# Patient Record
Sex: Male | Born: 1965
Health system: Southern US, Community
[De-identification: ages and names within clinical notes are randomized; demographics above are authoritative.]

## PROBLEM LIST (undated history)

## (undated) DIAGNOSIS — E669 Obesity, unspecified: Secondary | ICD-10-CM

## (undated) DIAGNOSIS — C73 Malignant neoplasm of thyroid gland: Secondary | ICD-10-CM

## (undated) DIAGNOSIS — E785 Hyperlipidemia, unspecified: Secondary | ICD-10-CM

## (undated) DIAGNOSIS — E079 Disorder of thyroid, unspecified: Secondary | ICD-10-CM

## (undated) DIAGNOSIS — I10 Essential (primary) hypertension: Secondary | ICD-10-CM

## (undated) HISTORY — DX: Malignant neoplasm of thyroid gland: C73

## (undated) HISTORY — DX: Essential (primary) hypertension: I10

## (undated) HISTORY — PX: OTHER SURGICAL HISTORY: SHX169

## (undated) HISTORY — DX: Hyperlipidemia, unspecified: E78.5

---

## 2006-04-12 ENCOUNTER — Ambulatory Visit (HOSPITAL_BASED_OUTPATIENT_CLINIC_OR_DEPARTMENT_OTHER): Admission: RE | Admit: 2006-04-12 | Discharge: 2006-04-12 | Payer: Self-pay | Admitting: Orthopedic Surgery

## 2007-10-09 ENCOUNTER — Encounter: Admission: RE | Admit: 2007-10-09 | Discharge: 2007-10-09 | Payer: Self-pay | Admitting: Orthopedic Surgery

## 2008-02-07 ENCOUNTER — Encounter: Admission: RE | Admit: 2008-02-07 | Discharge: 2008-02-07 | Payer: Self-pay | Admitting: Orthopedic Surgery

## 2008-05-06 ENCOUNTER — Emergency Department (HOSPITAL_COMMUNITY): Admission: EM | Admit: 2008-05-06 | Discharge: 2008-05-06 | Payer: Self-pay | Admitting: Emergency Medicine

## 2009-04-03 HISTORY — PX: THYROIDECTOMY: SHX17

## 2009-08-06 ENCOUNTER — Emergency Department (HOSPITAL_COMMUNITY): Admission: EM | Admit: 2009-08-06 | Discharge: 2009-08-06 | Payer: Self-pay | Admitting: Emergency Medicine

## 2009-10-27 ENCOUNTER — Encounter: Admission: RE | Admit: 2009-10-27 | Discharge: 2009-10-27 | Payer: Self-pay | Admitting: Endocrinology

## 2009-10-27 ENCOUNTER — Other Ambulatory Visit: Admission: RE | Admit: 2009-10-27 | Discharge: 2009-10-27 | Payer: Self-pay | Admitting: Interventional Radiology

## 2009-12-17 ENCOUNTER — Ambulatory Visit (HOSPITAL_COMMUNITY): Admission: RE | Admit: 2009-12-17 | Discharge: 2009-12-18 | Payer: Self-pay | Admitting: General Surgery

## 2009-12-17 ENCOUNTER — Encounter (INDEPENDENT_AMBULATORY_CARE_PROVIDER_SITE_OTHER): Payer: Self-pay | Admitting: General Surgery

## 2010-01-21 ENCOUNTER — Encounter (HOSPITAL_COMMUNITY)
Admission: RE | Admit: 2010-01-21 | Discharge: 2010-04-02 | Payer: Self-pay | Source: Home / Self Care | Attending: Endocrinology | Admitting: Endocrinology

## 2010-05-19 ENCOUNTER — Encounter: Payer: Self-pay | Admitting: Cardiology

## 2010-06-16 LAB — GLUCOSE, CAPILLARY
Glucose-Capillary: 100 mg/dL — ABNORMAL HIGH (ref 70–99)
Glucose-Capillary: 105 mg/dL — ABNORMAL HIGH (ref 70–99)

## 2010-06-16 LAB — SURGICAL PCR SCREEN
MRSA, PCR: NEGATIVE
Staphylococcus aureus: NEGATIVE

## 2010-06-16 LAB — COMPREHENSIVE METABOLIC PANEL
BUN: 25 mg/dL — ABNORMAL HIGH (ref 6–23)
Calcium: 9.7 mg/dL (ref 8.4–10.5)
Glucose, Bld: 120 mg/dL — ABNORMAL HIGH (ref 70–99)
Sodium: 140 mEq/L (ref 135–145)
Total Protein: 7.5 g/dL (ref 6.0–8.3)

## 2010-06-16 LAB — CALCIUM: Calcium: 9 mg/dL (ref 8.4–10.5)

## 2010-06-16 LAB — CBC
HCT: 38.3 % — ABNORMAL LOW (ref 39.0–52.0)
MCHC: 34 g/dL (ref 30.0–36.0)
MCV: 85.9 fL (ref 78.0–100.0)
RDW: 14.7 % (ref 11.5–15.5)

## 2010-06-21 LAB — RAPID URINE DRUG SCREEN, HOSP PERFORMED
Cocaine: NOT DETECTED
Opiates: NOT DETECTED
Tetrahydrocannabinol: NOT DETECTED

## 2010-06-21 LAB — CBC
HCT: 36.2 % — ABNORMAL LOW (ref 39.0–52.0)
Hemoglobin: 12.9 g/dL — ABNORMAL LOW (ref 13.0–17.0)
MCV: 84.4 fL (ref 78.0–100.0)
RBC: 4.29 MIL/uL (ref 4.22–5.81)
WBC: 6.3 10*3/uL (ref 4.0–10.5)

## 2010-06-21 LAB — DIFFERENTIAL
Basophils Absolute: 0.1 10*3/uL (ref 0.0–0.1)
Basophils Relative: 1 % (ref 0–1)
Lymphocytes Relative: 36 % (ref 12–46)
Monocytes Absolute: 0.7 10*3/uL (ref 0.1–1.0)
Neutro Abs: 3.1 10*3/uL (ref 1.7–7.7)

## 2010-06-21 LAB — URINALYSIS, ROUTINE W REFLEX MICROSCOPIC
Bilirubin Urine: NEGATIVE
Ketones, ur: NEGATIVE mg/dL
Nitrite: NEGATIVE
Specific Gravity, Urine: 1.005 (ref 1.005–1.030)
Urobilinogen, UA: 0.2 mg/dL (ref 0.0–1.0)
pH: 6.5 (ref 5.0–8.0)

## 2010-06-21 LAB — ETHANOL: Alcohol, Ethyl (B): <5 mg/dL (ref 0–10)

## 2010-06-21 LAB — GLUCOSE, CAPILLARY
Glucose-Capillary: 73 mg/dL (ref 70–99)
Glucose-Capillary: 99 mg/dL (ref 70–99)

## 2010-06-21 LAB — COMPREHENSIVE METABOLIC PANEL
Alkaline Phosphatase: 50 U/L (ref 39–117)
BUN: 27 mg/dL — ABNORMAL HIGH (ref 6–23)
CO2: 25 mEq/L (ref 19–32)
Chloride: 105 mEq/L (ref 96–112)
Creatinine, Ser: 1.49 mg/dL (ref 0.4–1.5)
GFR calc non Af Amer: 51 mL/min — ABNORMAL LOW (ref >60)
Glucose, Bld: 70 mg/dL (ref 70–99)
Potassium: 3.2 mEq/L — ABNORMAL LOW (ref 3.5–5.1)
Total Bilirubin: 0.5 mg/dL (ref 0.3–1.2)

## 2010-07-19 LAB — BASIC METABOLIC PANEL
BUN: 23 mg/dL (ref 6–23)
Calcium: 9.4 mg/dL (ref 8.4–10.5)
Chloride: 104 mEq/L (ref 96–112)
Creatinine, Ser: 1.38 mg/dL (ref 0.4–1.5)
GFR calc Af Amer: >60 mL/min (ref >60)
GFR calc non Af Amer: 56 mL/min — ABNORMAL LOW (ref >60)

## 2010-07-19 LAB — CBC
Platelets: 273 10*3/uL (ref 150–400)
RBC: 4.73 MIL/uL (ref 4.22–5.81)
WBC: 17.5 10*3/uL — ABNORMAL HIGH (ref 4.0–10.5)

## 2010-07-19 LAB — DIFFERENTIAL
Eosinophils Absolute: 0.1 10*3/uL (ref 0.0–0.7)
Lymphocytes Relative: 3 % — ABNORMAL LOW (ref 12–46)
Lymphs Abs: 0.5 10*3/uL — ABNORMAL LOW (ref 0.7–4.0)
Monocytes Relative: 4 % (ref 3–12)
Neutro Abs: 16.2 10*3/uL — ABNORMAL HIGH (ref 1.7–7.7)
Neutrophils Relative %: 93 % — ABNORMAL HIGH (ref 43–77)

## 2010-08-19 NOTE — Op Note (Signed)
NAME:  George Crane, George Crane NO.:  [id number]   MEDICAL RECORD NO.:  [id number]          PATIENT TYPE:  AMB   LOCATION:  [facility]                         FACILITY:  [facility]   PHYSICIAN:  Deidre Ala, M.D.    DATE OF BIRTH:  Mar 08, 1966   DATE OF PROCEDURE:  04/12/2006  DATE OF DISCHARGE:                               OPERATIVE REPORT   PREOPERATIVE DIAGNOSIS:  1. 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  2. 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   POSTOPERATIVE DIAGNOSIS:  1. Right knee tricompartmental DJD.  2. Degenerative medial lateral meniscus tears.  3. Tight lateral retinaculum.  4. Gouty deposition.  5. Left knee tricompartmental DJD.  6. Degenerative medial lateral meniscus tears.  7. Tight lateral retinaculum.  8. Gouty deposition.   OPERATION:  1. Right knee partial medial lateral meniscectomies.  2. Right knee abrasion ablation chondroplasty tricompartmental.  3. Right knee medial lateral plica excision.  4. Right knee lateral retinacular release.  5. Left knee medial lateral partial meniscectomies.  6. Left knee abrasion ablation chondroplasty tricompartmental.  7. Left knee medial plica excisions.  8. Lateral retinacular release.   SURGEON:  Dr. Doristine Section   ASSISTANT:  Phineas Semen, P.A.   ANESTHESIA:  General endotracheal.   CULTURES AND DRAINS:  None.   BLOOD LOSS:  Minimal.   TOURNIQUET TIME:  Right knee 1 hour 15 minutes, left knee 47 minutes.   PATHOLOGIC FINDINGS AND HISTORY:  George Crane is a 45 year old who  presented December 06, 2005 with bilateral knee pain of years' duration  with a history of gout on colchicine and allopurinol, never had any  crystal analysis but the medicines did seem to make him better.  He had  catching, locking, giving way. He was not bone on bone, slightly narrow  medially, or early osteophyte medial joint line.  We saw him and started  him on a cortisone  injection and we saw him back December 27, 2005 and  he had a great deal of improvement. By March 16, 2006 his pain  recurred.  He had been set up for knee arthroscopies because of a  reflare and we actually set him up on that date. On December 21,2007 we  gave him left knee aspiration and aspirated his knee and gave him  cortisone and then because of continuation of his discomfort he desired  to proceed with surgical intervention.  At surgery he basically had the  same findings in both knees.  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  ACL was intact.  There was  degenerative inner rim medial and meniscus tears on both knees. Lateral  meniscus on the right knee had a flipped-in component.  There was also  some pannus of scar in the pouch on the right that was all debrided.  He  had thorough abrasion ablation chondroplasties, lateral retinacular  release for lateral patellar tilt and track, partial meniscectomies and  plica excisions.  The plicas were huge and I am sure the thick synovium  anteriorly was part of his symptom complex.   DESCRIPTION OF PROCEDURE:  With adequate anesthesia obtained using  endotracheal technique, 1 gram Ancef given IV prophylaxis, the patient  was placed in the supine position.  Both lower extremities were prepped  from the malleoli to the tourniquets to the leg holders in the standard  fashion.  After standard prepping and draping we started on the left  knee, Esmarch examination used and the tourniquet was let up to 400  mmHg.  Superior lateral inflow portal was made and the knee was  insufflated normal saline with arthroscopic pump.  Medial lateral scope  portals were then made and the joint was thoroughly inspected.  I then  shaved out the medial plica back to the sidewall and  lysed the medial  band.  I then shaved the trochlear defect with basket and shaver and  smoothed with the ablator on 1 as well as the posterior patella.  I then  isolated the medial meniscus and shaved the inner rim with basket and  smoothed with the ablator as well as the medial femoral condyle.  Portals reversed and I then did similar shavings on the lateral meniscus  and smoothed with the ablator on 1.  Pouch was then synovectomized and  lateral release carried out.  All joint surfaces were smoothed.  The  knee was then irrigated through the scope, 0.5% Marcaine with morphine  was injected in the joint and portals. The portals were closed with 4-0  nylon.  A bulky sterile compressive dressing was applied with lateral  foam pad for tamponade and EZ-wrap placed for later.  The patient then  was turned to the right knee where the exact same procedure was repeated  as the left knee except for we reduced the lateral meniscus flipped-in  tear and resected it on the right and left the portals open for drainage  on the right side.  Otherwise the procedures were identical.  Compression dressing was applied.  The patient having tolerated  procedure well was awakened, taken to recovery room in satisfactory  condition to be discharged per outpatient routine, given Percocet and  Mepergan for pain, told to call the office for appointment for recheck  tomorrow.           ______________________________  V. Charlesetta Shanks, M.D.     VEP/MEDQ  D:  04/12/2006  T:  04/13/2006  Job:  [id number]

## 2010-08-31 ENCOUNTER — Ambulatory Visit (INDEPENDENT_AMBULATORY_CARE_PROVIDER_SITE_OTHER): Payer: 59 | Admitting: Cardiology

## 2010-08-31 ENCOUNTER — Encounter: Payer: Self-pay | Admitting: Cardiology

## 2010-08-31 DIAGNOSIS — R9431 Abnormal electrocardiogram [ECG] [EKG]: Secondary | ICD-10-CM

## 2010-08-31 DIAGNOSIS — I10 Essential (primary) hypertension: Secondary | ICD-10-CM

## 2010-08-31 DIAGNOSIS — R41840 Attention and concentration deficit: Secondary | ICD-10-CM | POA: Insufficient documentation

## 2010-08-31 DIAGNOSIS — E785 Hyperlipidemia, unspecified: Secondary | ICD-10-CM | POA: Insufficient documentation

## 2010-08-31 NOTE — Progress Notes (Signed)
HPI: 45 yo male with no prior cardiac history for evaluation of abnormal ECG. The patient has dyspnea with more extreme activities but not with routine activities. It is relieved with rest. It is not associated with chest pain. There is no orthopnea, PND or pedal edema. There is no syncope or palpitations. There is no exertional chest pain.   Current Outpatient Prescriptions  Medication Sig Dispense Refill  . amLODipine-valsartan (EXFORGE) 10-320 MG per tablet Take 1 tablet by mouth daily.        . Calcium Carbonate Antacid (TUMS PO) 1 tab po qd       . Choline Fenofibrate (TRILIPIX) 135 MG capsule Take 135 mg by mouth daily.        . febuxostat (ULORIC) 40 MG tablet Take 80 mg by mouth daily.        . hydrochlorothiazide 25 MG tablet Take 25 mg by mouth daily.        . insulin glargine (LANTUS) 100 UNIT/ML injection 30 units daily       . levothyroxine (LEVOTHROID) 137 MCG tablet 2 tabs po morning       . metformin (FORTAMET) 1000 MG (OSM) 24 hr tablet Take 1,000 mg by mouth 2 (two) times daily with a meal.        . Omega-3 Fatty Acids (FISH OIL) 1000 MG CAPS 1 tab po qd       . omeprazole (PRILOSEC) 20 MG capsule Take 20 mg by mouth daily.          Not on File  Past Medical History  Diagnosis Date  . Diabetes mellitus   . Hypertension   . Hyperlipidemia   . Thyroid cancer     Past Surgical History  Procedure Date  . Thyroidectomy 2011  . Right arm surgery     History   Social History  . Marital Status: Married    Spouse Name: N/A    Number of Children: 1  . Years of Education: N/A   Occupational History  .      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   Social History Main Topics  . Smoking status: Never Smoker   . Smokeless tobacco: Not on file  . Alcohol Use: Not on file  . Drug Use: Not on file  . Sexually Active: Not on file   Other Topics Concern  . Not on file   Social History Narrative  . No narrative on file    Family History  Problem Relation Age of Onset    . Colon cancer Father   . Lung cancer Mother     ROS: no fevers or chills, productive cough, hemoptysis, dysphasia, odynophagia, melena, hematochezia, dysuria, hematuria, rash, seizure activity, orthopnea, PND, pedal edema, claudication. Remaining systems are negative.  Physical Exam: General:  Well developed/obese in NAD Skin warm/dry; mild rash over lower extremties Patient not depressed No peripheral clubbing Back-normal HEENT-normal/normal eyelids Neck supple/normal carotid upstroke bilaterally; no bruits; no JVD; no thyromegaly chest - CTA/ normal expansion CV - RRR/normal S1 and S2; no murmurs, rubs or gallops;  PMI nondisplaced Abdomen -NT/ND, no HSM, no mass, + bowel sounds, no bruit 2+ femoral pulses, no bruits Ext-no edema, chords, 2+ DP Neuro-grossly nonfocal  ECG Normal sinus rhythm, RV conduction delay.

## 2010-08-31 NOTE — Assessment & Plan Note (Signed)
Electrocardiogram shows an RV conduction delay but otherwise unremarkable. He does have multiple risk factors and has some dyspnea on exertion. I have also recommended an exercise program. Plan exercise treadmill prior to initiating. If negative no further evaluation needed.

## 2010-08-31 NOTE — Patient Instructions (Signed)
Your physician recommends that you schedule a follow-up appointment as needed with Dr. Jens Som. Your physician has requested that you have an exercise tolerance test. For further information please visit [email or web address] Please also follow instruction sheet, as given.

## 2010-08-31 NOTE — Assessment & Plan Note (Signed)
Blood pressure controlled with present medications. Management per primary care.

## 2010-08-31 NOTE — Assessment & Plan Note (Signed)
Management per primary care. 

## 2010-09-14 ENCOUNTER — Encounter: Payer: 59 | Admitting: Physician Assistant

## 2010-09-19 ENCOUNTER — Encounter: Payer: 59 | Admitting: Physician Assistant

## 2010-10-24 ENCOUNTER — Ambulatory Visit (INDEPENDENT_AMBULATORY_CARE_PROVIDER_SITE_OTHER): Payer: 59 | Admitting: Physician Assistant

## 2010-10-24 DIAGNOSIS — R9431 Abnormal electrocardiogram [ECG] [EKG]: Secondary | ICD-10-CM

## 2010-10-24 NOTE — Progress Notes (Deleted)
Exercise Treadmill Test  Pre-Exercise Testing Evaluation Rhythm: normal sinus  Rate: 84   PR:  .15 QRS:  .09  QT:  .38 QTc: .45     Test  Exercise Tolerance Test Ordering MD: Olga Millers, MD  Interpreting MD:  Robet Leu PA-C  Unique Test No: 1  Treadmill:  1  Indication for ETT: Abnormal EKG  Contraindication to ETT: No   Stress Modality: exercise - treadmill  Cardiac Imaging Performed: non   Protocol: standard Bruce - maximal  Max BP:  ***/***  Max MPHR (bpm):  175 85% MPR (bpm):  148  MPHR obtained (bpm):  *** % MPHR obtained:  ***  Reached 85% MPHR (min:sec):  *** Total Exercise Time (min-sec):  ***  Workload in METS:  *** Borg Scale: ***  Reason ETT Terminated:  {CHL REASON TERMINATED FOR ETT:21021064}    ST Segment Analysis At Rest: {CHL ST SEGMENT AT REST FOR JYN:82956213} With Exercise: {CHL ST SEGMENT WITH EXERCISE FOR YQM:57846962}  Other Information Arrhythmia:  {CHL ARRHYTHMIA FOR XBM:84132440} Angina during ETT:  {CHL ANGINA DURING NUU:72536644} Quality of ETT:  {CHL QUALITY OF IHK:74259563}  ETT Interpretation:  {CHL INTERPRETATION FOR OVF:64332951}  Comments: ***  Recommendations: ***

## 2010-10-24 NOTE — Progress Notes (Signed)
Exercise Treadmill Test  Pre-Exercise Testing Evaluation Rhythm: normal sinus  Rate: 84   PR:  .15 QRS:  .09  QT:  .38 QTc: .45     Test  Exercise Tolerance Test Ordering MD: Olga Millers, MD  Interpreting MD:  Robet Leu PA-C  Unique Test No: 1  Treadmill:  1  Indication for ETT: Abnormal EKG  Contraindication to ETT: No   Stress Modality: exercise - treadmill  Cardiac Imaging Performed: non   Protocol: standard Bruce - maximal  Max BP:  216/39  Max MPHR (bpm):  175 85% MPR (bpm):  148  MPHR obtained (bpm):  162 % MPHR obtained:  92  Reached 85% MPHR (min:sec): 3:50 Total Exercise Time (min-sec):  5:26  Workload in METS:  7.5 Borg Scale: 17  Reason ETT Terminated:  desired heart rate attained    ST Segment Analysis At Rest: normal ST segments - no evidence of significant ST depression With Exercise: no evidence of significant ST depression  Other Information Arrhythmia:  No Angina during ETT:  absent (0) Quality of ETT:  diagnostic  ETT Interpretation:  normal - no evidence of ischemia by ST analysis  Comments: Fair exercise tolerance. No chest pain. Normal BP response to exercise. No ST-T changes to suggest ischemia.  Recommendations: Follow up with Dr. Jens Som as indicated.

## 2011-03-03 ENCOUNTER — Other Ambulatory Visit (HOSPITAL_COMMUNITY): Payer: Self-pay | Admitting: Endocrinology

## 2011-03-03 DIAGNOSIS — C73 Malignant neoplasm of thyroid gland: Secondary | ICD-10-CM

## 2011-03-20 ENCOUNTER — Encounter (HOSPITAL_COMMUNITY)
Admission: RE | Admit: 2011-03-20 | Discharge: 2011-03-20 | Disposition: A | Discharge status: Home / Self Care | Payer: 59 | Source: Ambulatory Visit | Attending: Endocrinology | Admitting: Endocrinology

## 2011-03-20 DIAGNOSIS — C73 Malignant neoplasm of thyroid gland: Secondary | ICD-10-CM | POA: Insufficient documentation

## 2011-03-20 MED ORDER — THYROTROPIN ALFA 1.1 MG IM SOLR
0.9000 mg | INTRAMUSCULAR | Status: AC
  Administered 2011-03-20: 0.9 mg via INTRAMUSCULAR
  Filled 2011-03-20: qty 0.9

## 2011-03-21 ENCOUNTER — Encounter (HOSPITAL_COMMUNITY)
Admission: RE | Admit: 2011-03-21 | Discharge: 2011-03-21 | Disposition: A | Discharge status: Home / Self Care | Payer: 59 | Source: Ambulatory Visit | Attending: Endocrinology | Admitting: Endocrinology

## 2011-03-21 DIAGNOSIS — C73 Malignant neoplasm of thyroid gland: Secondary | ICD-10-CM | POA: Insufficient documentation

## 2011-03-21 MED ORDER — THYROTROPIN ALFA 1.1 MG IM SOLR
0.9000 mg | INTRAMUSCULAR | Status: AC
  Administered 2011-03-21: 0.9 mg via INTRAMUSCULAR
  Filled 2011-03-21: qty 0.9

## 2011-03-22 ENCOUNTER — Encounter (HOSPITAL_COMMUNITY)
Admission: RE | Admit: 2011-03-22 | Discharge: 2011-03-22 | Disposition: A | Discharge status: Home / Self Care | Payer: 59 | Source: Ambulatory Visit | Attending: Endocrinology | Admitting: Endocrinology

## 2011-03-22 DIAGNOSIS — C73 Malignant neoplasm of thyroid gland: Secondary | ICD-10-CM | POA: Insufficient documentation

## 2011-03-22 MED ORDER — SODIUM IODIDE I 131 CAPSULE
4.0000 | Freq: Once | INTRAVENOUS | Status: AC | PRN
  Administered 2011-03-22: 4 via ORAL

## 2011-03-24 ENCOUNTER — Ambulatory Visit (HOSPITAL_COMMUNITY)
Admission: RE | Admit: 2011-03-24 | Discharge: 2011-03-24 | Disposition: A | Discharge status: Home / Self Care | Payer: 59 | Source: Ambulatory Visit | Attending: Endocrinology | Admitting: Endocrinology

## 2011-03-24 DIAGNOSIS — C73 Malignant neoplasm of thyroid gland: Secondary | ICD-10-CM | POA: Insufficient documentation

## 2011-03-24 MED ORDER — SODIUM IODIDE I 131 CAPSULE
4.0000 | Freq: Once | INTRAVENOUS | Status: AC | PRN
  Administered 2011-03-24: 4 via ORAL

## 2013-04-11 ENCOUNTER — Other Ambulatory Visit (HOSPITAL_COMMUNITY): Payer: Self-pay | Admitting: Endocrinology

## 2013-04-11 DIAGNOSIS — C73 Malignant neoplasm of thyroid gland: Secondary | ICD-10-CM

## 2013-04-28 ENCOUNTER — Encounter (HOSPITAL_COMMUNITY)
Admission: RE | Admit: 2013-04-28 | Discharge: 2013-04-28 | Disposition: A | Discharge status: Home / Self Care | Payer: 59 | Source: Ambulatory Visit | Attending: Endocrinology | Admitting: Endocrinology

## 2013-04-28 ENCOUNTER — Encounter (HOSPITAL_COMMUNITY): Payer: Self-pay

## 2013-04-28 DIAGNOSIS — C73 Malignant neoplasm of thyroid gland: Secondary | ICD-10-CM | POA: Insufficient documentation

## 2013-04-28 MED ORDER — THYROTROPIN ALFA 1.1 MG IM SOLR
0.9000 mg | INTRAMUSCULAR | Status: AC
  Administered 2013-04-28: 0.9 mg via INTRAMUSCULAR

## 2013-04-28 MED ORDER — THYROTROPIN ALFA 1.1 MG IM SOLR
0.9000 mg | INTRAMUSCULAR | Status: AC
  Administered 2013-04-29: 0.9 mg via INTRAMUSCULAR

## 2013-04-28 MED ORDER — THYROTROPIN ALFA 1.1 MG IM SOLR
INTRAMUSCULAR | Status: AC
  Administered 2013-04-28: 0.9 mg via INTRAMUSCULAR
  Filled 2013-04-28: qty 0.9

## 2013-04-29 ENCOUNTER — Encounter (HOSPITAL_COMMUNITY)
Admission: RE | Admit: 2013-04-29 | Discharge: 2013-04-29 | Disposition: A | Discharge status: Home / Self Care | Payer: 59 | Source: Ambulatory Visit | Attending: Endocrinology | Admitting: Endocrinology

## 2013-04-30 ENCOUNTER — Encounter (HOSPITAL_COMMUNITY)
Admission: RE | Admit: 2013-04-30 | Discharge: 2013-04-30 | Disposition: A | Discharge status: Home / Self Care | Payer: 59 | Source: Ambulatory Visit | Attending: Endocrinology | Admitting: Endocrinology

## 2013-04-30 MED ORDER — SODIUM IODIDE I 131 CAPSULE
4.0000 | Freq: Once | INTRAVENOUS | Status: AC | PRN
  Administered 2013-04-30: 4 via ORAL

## 2013-05-02 ENCOUNTER — Encounter (HOSPITAL_COMMUNITY)
Admission: RE | Admit: 2013-05-02 | Discharge: 2013-05-02 | Disposition: A | Discharge status: Home / Self Care | Payer: 59 | Source: Ambulatory Visit | Attending: Endocrinology | Admitting: Endocrinology

## 2014-01-07 ENCOUNTER — Encounter: Payer: Self-pay | Admitting: Physician Assistant

## 2014-04-15 ENCOUNTER — Other Ambulatory Visit (HOSPITAL_COMMUNITY): Payer: Self-pay | Admitting: Endocrinology

## 2014-04-15 DIAGNOSIS — C73 Malignant neoplasm of thyroid gland: Secondary | ICD-10-CM

## 2014-05-11 ENCOUNTER — Encounter (HOSPITAL_COMMUNITY): Admission: RE | Admit: 2014-05-11 | Payer: 59 | Source: Ambulatory Visit

## 2014-05-12 ENCOUNTER — Encounter (HOSPITAL_COMMUNITY): Payer: 59

## 2014-05-13 ENCOUNTER — Encounter (HOSPITAL_COMMUNITY)
Admission: RE | Admit: 2014-05-13 | Discharge: 2014-05-13 | Disposition: A | Discharge status: Home / Self Care | Payer: 59 | Source: Ambulatory Visit | Attending: Endocrinology | Admitting: Endocrinology

## 2014-05-13 ENCOUNTER — Encounter (HOSPITAL_COMMUNITY): Payer: 59

## 2014-05-13 ENCOUNTER — Encounter (HOSPITAL_COMMUNITY): Payer: Self-pay

## 2014-05-13 DIAGNOSIS — C73 Malignant neoplasm of thyroid gland: Secondary | ICD-10-CM | POA: Diagnosis present

## 2014-05-13 MED ORDER — THYROTROPIN ALFA 1.1 MG IM SOLR
INTRAMUSCULAR | Status: AC
  Administered 2014-05-13: 0.9 mg via INTRAMUSCULAR
  Filled 2014-05-13: qty 0.9

## 2014-05-13 MED ORDER — THYROTROPIN ALFA 1.1 MG IM SOLR
0.9000 mg | INTRAMUSCULAR | Status: AC
  Administered 2014-05-13: 0.9 mg via INTRAMUSCULAR

## 2014-05-14 ENCOUNTER — Encounter (HOSPITAL_COMMUNITY)
Admission: RE | Admit: 2014-05-14 | Discharge: 2014-05-14 | Disposition: A | Discharge status: Home / Self Care | Payer: 59 | Source: Ambulatory Visit | Attending: Endocrinology | Admitting: Endocrinology

## 2014-05-14 DIAGNOSIS — C73 Malignant neoplasm of thyroid gland: Secondary | ICD-10-CM | POA: Diagnosis not present

## 2014-05-14 MED ORDER — THYROTROPIN ALFA 1.1 MG IM SOLR
0.9000 mg | INTRAMUSCULAR | Status: AC
  Administered 2014-05-14: 0.9 mg via INTRAMUSCULAR

## 2014-05-15 ENCOUNTER — Encounter (HOSPITAL_COMMUNITY)
Admission: RE | Admit: 2014-05-15 | Discharge: 2014-05-15 | Disposition: A | Discharge status: Home / Self Care | Payer: 59 | Source: Ambulatory Visit | Attending: Endocrinology | Admitting: Endocrinology

## 2014-05-15 ENCOUNTER — Encounter (HOSPITAL_COMMUNITY): Payer: 59

## 2014-05-15 ENCOUNTER — Encounter (HOSPITAL_COMMUNITY): Payer: Self-pay

## 2014-05-15 MED ORDER — SODIUM IODIDE I 131 CAPSULE: 4.0000 | Freq: Once | INTRAVENOUS | Status: AC | PRN

## 2014-05-18 ENCOUNTER — Encounter (HOSPITAL_COMMUNITY)
Admission: RE | Admit: 2014-05-18 | Discharge: 2014-05-18 | Disposition: A | Discharge status: Home / Self Care | Payer: 59 | Source: Ambulatory Visit | Attending: Endocrinology | Admitting: Endocrinology

## 2014-05-18 DIAGNOSIS — C73 Malignant neoplasm of thyroid gland: Secondary | ICD-10-CM | POA: Diagnosis not present

## 2014-10-01 ENCOUNTER — Other Ambulatory Visit: Payer: Self-pay | Admitting: Surgery

## 2014-10-06 ENCOUNTER — Encounter: Payer: 59 | Attending: Surgery | Admitting: Dietician

## 2014-10-06 ENCOUNTER — Encounter: Payer: Self-pay | Admitting: Dietician

## 2014-10-06 DIAGNOSIS — Z713 Dietary counseling and surveillance: Secondary | ICD-10-CM | POA: Diagnosis not present

## 2014-10-06 DIAGNOSIS — Z6841 Body Mass Index (BMI) 40.0 and over, adult: Secondary | ICD-10-CM | POA: Insufficient documentation

## 2014-10-06 NOTE — Patient Instructions (Signed)

## 2014-10-06 NOTE — Progress Notes (Signed)
  Pre-Op Assessment Visit:  Pre-Operative Sleeve Gastrectomy Surgery  Medical Nutrition Therapy:  Appt start time: 1610  End time:  1800.  Patient was seen on 10/06/2014 for Pre-Operative Nutrition Assessment. Assessment and letter of approval faxed to [facility] Bariatric Surgery Program coordinator on 10/06/2014.   Preferred Learning Style:   No preference indicated   Learning Readiness:   Ready  Handouts given during visit include:  Pre-Op Goals Bariatric Surgery Protein Shakes   During the appointment today the following Pre-Op Goals were reviewed with the patient: Maintain or lose weight as instructed by your surgeon Make healthy food choices Begin to limit portion sizes Limited concentrated sugars and fried foods Keep fat/sugar in the single digits per serving on   food labels Practice CHEWING your food  (aim for 30 chews per bite or until applesauce consistency) Practice not drinking 15 minutes before, during, and 30 minutes after each meal/snack Avoid all carbonated beverages  Avoid/limit caffeinated beverages  Avoid all sugar-sweetened beverages Consume 3 meals per day; eat every 3-5 hours Make a list of non-food related activities Aim for 64-100 ounces of FLUID daily  Aim for at least 60-80 grams of PROTEIN daily Look for a liquid protein source that contain ?15 g protein and ?5 g carbohydrate  (ex: shakes, drinks, shots)  Patient-Centered Goals: Goals: reduce/reverse diabetes  9 level of confidence, 10 level of importance specific/non-scale and confidence/importance scale 1-10  Demonstrated degree of understanding via:  Teach Back  Teaching Method Utilized:  Visual Auditory Hands on  Barriers to learning/adherence to lifestyle change: none  Patient to [facility] the [facility] to enroll in Pre-Op and Post-Op Nutrition Education when surgery date is scheduled.

## 2014-11-05 ENCOUNTER — Encounter: Payer: 59 | Attending: Surgery | Admitting: Dietician

## 2014-11-05 ENCOUNTER — Encounter: Payer: Self-pay | Admitting: Dietician

## 2014-11-05 DIAGNOSIS — Z6841 Body Mass Index (BMI) 40.0 and over, adult: Secondary | ICD-10-CM | POA: Insufficient documentation

## 2014-11-05 DIAGNOSIS — Z713 Dietary counseling and surveillance: Secondary | ICD-10-CM | POA: Diagnosis not present

## 2014-11-05 NOTE — Progress Notes (Signed)
  6 Months Supervised Weight Loss Visit:   Pre-Operative Sleeve Gastrectomy Surgery  Medical Nutrition Therapy:  Appt start time: 1700 end time:  3546.  Primary concerns today: Supervised Weight Loss Visit. Returns with a 2 lb with gain. Has cut down to one soda per and is trying protein. Eating 3 meals per day. Started working on chewing well. Waiting 30 minutes after meals before drinking. Not having sugar or fried foods. Has been walking 7 x days per week.  Weight: 361.0 BMI: 49.0  Preferred Learning Style:   No preference indicated   Learning Readiness:   Ready   Recent physical activity:  Walking 20-30 minutes 7 x week   Handouts given out during appointment: Meal Card  Progress Towards Goal(s):  In progress.   Nutritional Diagnosis:  [place]-3.3 Obesity related to past poor dietary habits and physical inactivity as evidenced by patient attending supervised weight loss for insurance approval of bariatric surgery.    Intervention:  Nutrition counseling provided. Plan: Start having more protein and vegetables at meals and limit carbs. Continue working on chewing well (20-30 x per bite).  Teaching Method Utilized:  Visual Auditory Hands on  Barriers to learning/adherence to lifestyle change: none  Demonstrated degree of understanding via:  Teach Back   Monitoring/Evaluation:  Dietary intake, exercise, and body weight. Follow up in 1 months for 6 month supervised weight loss visit.

## 2014-11-05 NOTE — Patient Instructions (Signed)
Start having more protein and vegetables at meals and limit carbs. Continue working on chewing well (20-30 x per bite).

## 2014-12-09 ENCOUNTER — Encounter: Payer: 59 | Attending: Surgery | Admitting: Dietician

## 2014-12-09 ENCOUNTER — Encounter: Payer: Self-pay | Admitting: Dietician

## 2014-12-09 DIAGNOSIS — Z6841 Body Mass Index (BMI) 40.0 and over, adult: Secondary | ICD-10-CM | POA: Insufficient documentation

## 2014-12-09 DIAGNOSIS — Z713 Dietary counseling and surveillance: Secondary | ICD-10-CM | POA: Insufficient documentation

## 2014-12-09 NOTE — Patient Instructions (Addendum)
Continue working on cutting back on carbs. Continue working on chewing well (20-30 x per bite). Continue cutting back on soda and try water with cucumber/lemon.

## 2014-12-09 NOTE — Progress Notes (Signed)
  6 Months Supervised Weight Loss Visit:   Pre-Operative Sleeve Gastrectomy Surgery  Medical Nutrition Therapy:  Appt start time: 8882 end time:  8003.  Primary concerns today: Supervised Weight Loss Visit #2. Returns with a 2 lb weight loss. Has cut down to one 12 oz soda per day. Has a few protein shakes that he likes. Has started cutting back on some carbs (potatoes and spaghetti). Not having fried or sugar foods. Still working on chewing well and waiting 30 minutes after meals before drinking. Has been walking 7 x days per week for 30-45 minutes. Drinks 100 oz of water each day.   Weight: 359.0 BMI: 48.8  Preferred Learning Style:   No preference indicated   Learning Readiness:   Ready   Recent physical activity:  Walking 20-30 minutes 7 x week   Handouts given out during appointment: Meal Card  Progress Towards Goal(s):  In progress.   Nutritional Diagnosis:  -3.3 Obesity related to past poor dietary habits and physical inactivity as evidenced by patient attending supervised weight loss for insurance approval of bariatric surgery.    Intervention:  Nutrition counseling provided. Plan: Continue working on cutting back on carbs. Continue working on chewing well (20-30 x per bite). Continue cutting back on soda and try water with cucumber/lemon.   Teaching Method Utilized:  Visual Auditory Hands on  Barriers to learning/adherence to lifestyle change: none  Demonstrated degree of understanding via:  Teach Back   Monitoring/Evaluation:  Dietary intake, exercise, and body weight. Follow up in 1 months for 6 month supervised weight loss visit.

## 2015-01-05 ENCOUNTER — Encounter: Payer: Self-pay | Admitting: Dietician

## 2015-01-05 ENCOUNTER — Encounter: Payer: 59 | Attending: Surgery | Admitting: Dietician

## 2015-01-05 DIAGNOSIS — Z6841 Body Mass Index (BMI) 40.0 and over, adult: Secondary | ICD-10-CM | POA: Insufficient documentation

## 2015-01-05 DIAGNOSIS — Z713 Dietary counseling and surveillance: Secondary | ICD-10-CM | POA: Insufficient documentation

## 2015-01-05 NOTE — Patient Instructions (Addendum)
Continue working on cutting back on carbs. Continue working on chewing well (20-30 x per bite). Continue cutting back on soda. Continue to try protein shakes.

## 2015-01-05 NOTE — Progress Notes (Signed)
  6 Months Supervised Weight Loss Visit:   Pre-Operative Sleeve Gastrectomy Surgery  Medical Nutrition Therapy:  Appt start time: 1150 end time:  1205.  Primary concerns today: Supervised Weight Loss Visit #3. Returns with a 8 lb weight loss. Has been more mindful about chewing well. Continuing to work on all Pre-Op Goals. Has been walking 7 x days per week for 30-45 minutes. Drinks 100 oz of water each day.   Weight: 351.5 lbs BMI: 47.7  Preferred Learning Style:   No preference indicated   Learning Readiness:   Ready   Recent physical activity:  Walking 20-30 minutes 7 x week   Handouts given out during appointment: Meal Card  Progress Towards Goal(s):  In progress.   Nutritional Diagnosis:  [place]-3.3 Obesity related to past poor dietary habits and physical inactivity as evidenced by patient attending supervised weight loss for insurance approval of bariatric surgery.    Intervention:  Nutrition counseling provided. Plan: Continue working on cutting back on carbs. Continue working on chewing well (20-30 x per bite). Continue cutting back on soda and try water with cucumber/lemon.   Teaching Method Utilized:  Visual Auditory Hands on  Barriers to learning/adherence to lifestyle change: none  Demonstrated degree of understanding via:  Teach Back   Monitoring/Evaluation:  Dietary intake, exercise, and body weight. Follow up in 1 months for 6 month supervised weight loss visit.

## 2015-02-04 ENCOUNTER — Encounter: Payer: Self-pay | Admitting: Dietician

## 2015-02-04 ENCOUNTER — Encounter: Payer: 59 | Attending: Surgery | Admitting: Dietician

## 2015-02-04 DIAGNOSIS — Z6841 Body Mass Index (BMI) 40.0 and over, adult: Secondary | ICD-10-CM | POA: Insufficient documentation

## 2015-02-04 DIAGNOSIS — Z713 Dietary counseling and surveillance: Secondary | ICD-10-CM | POA: Insufficient documentation

## 2015-02-04 NOTE — Progress Notes (Signed)
  6 Months Supervised Weight Loss Visit:   Pre-Operative Sleeve Gastrectomy Surgery  Medical Nutrition Therapy:  Appt start time: 440 end time:  258.  Primary concerns today: Supervised Weight Loss Visit #4. Returns with a 1 lb weight gain. Has quit sodas completely and drinking Diet V8 Splash water instead. Has been chewing well and feels like he has it down. Not drinking during meals.   Has been walking 7 x days per week for 30-45 minutes. Drinks 100 oz of water each day. Still trying protein shakes.  Weight: 352.6 lbs BMI: 47.8  Preferred Learning Style:   No preference indicated   Learning Readiness:   Ready   Recent physical activity:  Walking 20-30 minutes 7 x week   Handouts given out during appointment: Meal Card  Progress Towards Goal(s):  In progress.   Nutritional Diagnosis:  [place]-3.3 Obesity related to past poor dietary habits and physical inactivity as evidenced by patient attending supervised weight loss for insurance approval of bariatric surgery.    Intervention:  Nutrition counseling provided. Plan: Continue working on cutting back on carbs. Continue working on chewing well (20-30 x per bite). Continue to try protein shakes (check to make sure carbs are 5 g and under).   Teaching Method Utilized:  Visual Auditory Hands on  Barriers to learning/adherence to lifestyle change: none  Demonstrated degree of understanding via:  Teach Back   Monitoring/Evaluation:  Dietary intake, exercise, and body weight. Follow up in 1 months for 6 month supervised weight loss visit.

## 2015-02-04 NOTE — Patient Instructions (Addendum)
Continue working on cutting back on carbs. Continue working on chewing well (20-30 x per bite). Continue to try protein shakes (check to make sure carbs are 5 g and under).

## 2015-03-04 ENCOUNTER — Encounter: Payer: 59 | Attending: Surgery | Admitting: Dietician

## 2015-03-04 ENCOUNTER — Encounter: Payer: Self-pay | Admitting: Dietician

## 2015-03-04 DIAGNOSIS — Z6841 Body Mass Index (BMI) 40.0 and over, adult: Secondary | ICD-10-CM | POA: Diagnosis not present

## 2015-03-04 DIAGNOSIS — Z713 Dietary counseling and surveillance: Secondary | ICD-10-CM | POA: Diagnosis not present

## 2015-03-04 NOTE — Patient Instructions (Signed)
Continue working on cutting back on carbs. Continue working on chewing well (20-30 x per bite). Continue to try protein shakes (check to make sure carbs are 5 g and under).   

## 2015-03-04 NOTE — Progress Notes (Signed)
  6 Months Supervised Weight Loss Visit:   Pre-Operative Sleeve Gastrectomy Surgery  Medical Nutrition Therapy:  Appt start time: [id number] end time:  500.  Primary concerns today: Supervised Weight Loss Visit #5. Returns with a 1 lb weight gain. Has quit sodas completely and drinking Diet V8 Splash water instead. Has been chewing well and feels like he has it down. Not drinking during meals.   Has been walking 7 x days per week for 30-45 minutes. Drinks 100 oz of water each day. Still trying protein shakes. Likes Whey protein powder and watching carbs on shakes.   Weight: 353.7 lbs BMI: 48.0  Preferred Learning Style:   No preference indicated   Learning Readiness:   Ready   Recent physical activity:  Walking 20-30 minutes 7 x week   Handouts given out during appointment: none  Progress Towards Goal(s):  In progress.   Nutritional Diagnosis:  [place]-3.3 Obesity related to past poor dietary habits and physical inactivity as evidenced by patient attending supervised weight loss for insurance approval of bariatric surgery.    Intervention:  Nutrition counseling provided. Plan: Continue working on cutting back on carbs. Continue working on chewing well (20-30 x per bite). Continue to try protein shakes (check to make sure carbs are 5 g and under).   Teaching Method Utilized:  Visual Auditory Hands on  Barriers to learning/adherence to lifestyle change: none  Demonstrated degree of understanding via:  Teach Back   Monitoring/Evaluation:  Dietary intake, exercise, and body weight. Follow up in 1 months for 6 month supervised weight loss visit.

## 2015-04-09 ENCOUNTER — Encounter: Payer: 59 | Attending: Surgery | Admitting: Skilled Nursing Facility1

## 2015-04-09 DIAGNOSIS — Z713 Dietary counseling and surveillance: Secondary | ICD-10-CM | POA: Diagnosis not present

## 2015-04-09 DIAGNOSIS — Z6841 Body Mass Index (BMI) 40.0 and over, adult: Secondary | ICD-10-CM | POA: Insufficient documentation

## 2015-04-09 NOTE — Progress Notes (Signed)
  6 Months Supervised Weight Loss Visit:   Pre-Operative Sleeve Gastrectomy Surgery  Medical Nutrition Therapy:  Appt start time: [id number] end time:  500.  Primary concerns today: Supervised Weight Loss Visit #6. Returns with weight being maintained. Pt states he continues to work on everything: drinking water, chewing, and trying protein shakes/powders. Pt states he feels better now that he has a carbohydrate number to stay under. Pt states he still walks during work.  This is pts last SWL and states he is excited and totally ready for surgery.   Weight: 353.7 lbs BMI: 48.0   Preferred Learning Style:   No preference indicated   Learning Readiness:   Ready   Recent physical activity:  Walking 20-30 minutes 7 x week   Handouts given out during appointment: none  Progress Towards Goal(s):  In progress.   Nutritional Diagnosis:  [place]-3.3 Obesity related to past poor dietary habits and physical inactivity as evidenced by patient attending supervised weight loss for insurance approval of bariatric surgery.    Intervention:  Nutrition counseling provided. Plan: Continue working on cutting back on carbs. Continue working on chewing well (20-30 x per bite). Continue to try protein shakes (check to make sure carbs are 5 g and under).   Teaching Method Utilized:  Visual Auditory  Barriers to learning/adherence to lifestyle change: none  Demonstrated degree of understanding via:  Teach Back   Monitoring/Evaluation:  Dietary intake, exercise, and body weight. Follow up in 1 months for 6 month supervised weight loss visit.

## 2015-04-12 ENCOUNTER — Ambulatory Visit: Payer: 59 | Admitting: Skilled Nursing Facility1

## 2015-04-14 ENCOUNTER — Other Ambulatory Visit: Payer: Self-pay | Admitting: Surgery

## 2015-04-23 ENCOUNTER — Other Ambulatory Visit: Payer: Self-pay | Admitting: Surgery

## 2015-05-19 ENCOUNTER — Ambulatory Visit (HOSPITAL_COMMUNITY)
Admission: RE | Admit: 2015-05-19 | Discharge: 2015-05-19 | Disposition: A | Discharge status: Home / Self Care | Payer: 59 | Source: Ambulatory Visit | Attending: Surgery | Admitting: Surgery

## 2015-05-19 ENCOUNTER — Other Ambulatory Visit (HOSPITAL_COMMUNITY)
Admission: RE | Admit: 2015-05-19 | Discharge: 2015-05-19 | Disposition: A | Discharge status: Home / Self Care | Payer: 59 | Source: Ambulatory Visit | Attending: Surgery | Admitting: Surgery

## 2015-05-19 DIAGNOSIS — I10 Essential (primary) hypertension: Secondary | ICD-10-CM | POA: Diagnosis not present

## 2015-05-19 DIAGNOSIS — Z85850 Personal history of malignant neoplasm of thyroid: Secondary | ICD-10-CM | POA: Diagnosis not present

## 2015-05-19 DIAGNOSIS — E119 Type 2 diabetes mellitus without complications: Secondary | ICD-10-CM | POA: Insufficient documentation

## 2015-05-19 DIAGNOSIS — E890 Postprocedural hypothyroidism: Secondary | ICD-10-CM | POA: Diagnosis not present

## 2015-05-19 DIAGNOSIS — Z01818 Encounter for other preprocedural examination: Secondary | ICD-10-CM | POA: Insufficient documentation

## 2015-06-14 ENCOUNTER — Ambulatory Visit (HOSPITAL_COMMUNITY)
Admission: RE | Admit: 2015-06-14 | Discharge: 2015-06-14 | Disposition: A | Discharge status: Home / Self Care | Payer: 59 | Source: Ambulatory Visit | Attending: Cardiology | Admitting: Cardiology

## 2015-06-14 DIAGNOSIS — I4510 Unspecified right bundle-branch block: Secondary | ICD-10-CM | POA: Insufficient documentation

## 2015-06-14 DIAGNOSIS — R9431 Abnormal electrocardiogram [ECG] [EKG]: Secondary | ICD-10-CM | POA: Insufficient documentation

## 2015-12-20 ENCOUNTER — Encounter: Payer: 59 | Attending: Surgery | Admitting: Dietician

## 2015-12-20 DIAGNOSIS — Z6841 Body Mass Index (BMI) 40.0 and over, adult: Secondary | ICD-10-CM | POA: Diagnosis not present

## 2015-12-20 DIAGNOSIS — Z85850 Personal history of malignant neoplasm of thyroid: Secondary | ICD-10-CM | POA: Diagnosis not present

## 2015-12-20 DIAGNOSIS — E119 Type 2 diabetes mellitus without complications: Secondary | ICD-10-CM | POA: Diagnosis present

## 2015-12-20 DIAGNOSIS — I10 Essential (primary) hypertension: Secondary | ICD-10-CM | POA: Diagnosis not present

## 2015-12-20 DIAGNOSIS — E7800 Pure hypercholesterolemia, unspecified: Secondary | ICD-10-CM | POA: Diagnosis not present

## 2015-12-20 DIAGNOSIS — K219 Gastro-esophageal reflux disease without esophagitis: Secondary | ICD-10-CM | POA: Insufficient documentation

## 2015-12-20 DIAGNOSIS — E079 Disorder of thyroid, unspecified: Secondary | ICD-10-CM | POA: Insufficient documentation

## 2015-12-20 DIAGNOSIS — G4730 Sleep apnea, unspecified: Secondary | ICD-10-CM | POA: Diagnosis not present

## 2015-12-23 ENCOUNTER — Encounter: Payer: Self-pay | Admitting: Dietician

## 2015-12-23 NOTE — Progress Notes (Signed)
  Pre-Operative Nutrition Class:  Appt start time: 9728   End time:  1830.  Patient was seen on 12/20/2015 for Pre-Operative Bariatric Surgery Education at [facility].   Surgery date:  Surgery type: Sleeve gastrectomy Start weight at [facility]: 359 lbs on 10/06/2014 Weight today: 357.2 lbs  TANITA  BODY COMP RESULTS  12/20/15   BMI (kg/m^2) 48.4   Fat Mass (lbs) 156.4   Fat Free Mass (lbs) 200.8   Total Body Water (lbs) 156   Samples given per MNT protocol. Patient educated on appropriate usage: Premier protein shake (chocolate - qty 1) Lot #: [id number] Exp: 10/2016  Bariatric Advantage Multivitamin chew (mixed fruit - qty 1) Lot #: [id number] Exp: 10/2016  Bariatric Advantage Calcium Citrate chew (strawberry - qty 1) Lot #: [id number] Exp: 08/2016  Unjury Protein Powder (vanilla - qty 1) Lot #: [id number] Exp: 04/2017  The following the learning objectives were met by the patient during this course:  Identify Pre-Op Dietary Goals and will begin 2 weeks pre-operatively  Identify appropriate sources of fluids and proteins   State protein recommendations and appropriate sources pre and post-operatively  Identify Post-Operative Dietary Goals and will follow for 2 weeks post-operatively  Identify appropriate multivitamin and calcium sources  Describe the need for physical activity post-operatively and will follow MD recommendations  State when to call healthcare provider regarding medication questions or post-operative complications  Handouts given during class include:  Pre-Op Bariatric Surgery Diet Handout  Protein Shake Handout  Post-Op Bariatric Surgery Nutrition Handout  BELT Program Information Flyer  Support Group Information Flyer  [facility] Bariatric Supplements Price List  Follow-Up Plan: Patient will follow-up at [facility] 2 weeks post operatively for diet advancement per MD.

## 2015-12-31 NOTE — Progress Notes (Signed)
Pt is being scheduled for preop appt; please place surgical orders in epic. Thanks.  

## 2016-01-06 ENCOUNTER — Ambulatory Visit: Payer: Self-pay | Admitting: Surgery

## 2016-01-06 NOTE — H&P (Signed)
George A. Georgann Housekeeper Location: [facility] Patient #: [id number] DOB: 03/21/1966 Married / Language: English / Race: White Male   History of Present Illness  The patient is a 50 year old male who presents for a sleeve gastrectomy.   He is attended one of our seminars and is interested in sleeve gastrectomy. He is a 50 year-old white male who is followed by Dr. Chalmers Cater. He underwent a total thyroidectomy for thyroid cancer 6 years ago by Dr. Ronnald Collum. At about the same time he was found to be diabetic. His other comorbidities include hyperlipidemia and hypertension. He's research in sleeve gastrectomy and as I said has been to our seminar presentation in pursuing this. I described this procedure to him in some detail and went over the risks and complications not limited to bleeding and leaks from the staple line.  He reports that he is has yearly scans and there is no evidence that he has any persistent thyroid cancer. He has tried numerous attempts at weight loss and has been unsuccessful at sustained weight loss.  He denies any history of deep venous thrombosis and has had no prior abdominal surgery. He does have obstructive sleep apnea and wears a mask. He does have symptomatic GERD effectively treated with Prilosec. UGI showed no hiatal hernia   Problem List/Past Medical   Other Problems High blood pressure Hypercholesterolemia Sleep Apnea Arthritis Diabetes Mellitus Gastroesophageal Reflux Disease Thyroid Cancer Thyroid Disease  Past Surgical History  Knee Surgery Bilateral. Thyroid Surgery  Diagnostic Studies History  Colonoscopy 1-5 years ago UGI no hiatal hernia  Allergies  No Known Drug Allergies  Medication History (Chemira Jones; 10/01/2014 9:39 AM) Amlodipine Besylate-Valsartan (10-320MG  Tablet, Oral) Active. Chlorthalidone (25MG  Tablet, Oral) Active. Fenofibric Acid (135MG  Capsule DR, Oral) Active. Lantus SoloStar (100UNIT/ML Soln Pen-inj,  Subcutaneous) Active. Levothyroxine Sodium (112MCG Tablet, Oral) Active. MetFORMIN HCl (1000MG  Tablet, Oral) Active. Omeprazole (20MG  Capsule DR, Oral) Active. Simvastatin (20MG  Tablet, Oral) Active. Uloric (40MG  Tablet, Oral) Active. Medications Reconciled  Social History No drug use Tobacco use Never smoker. Alcohol use Recently quit alcohol use. Caffeine use Carbonated beverages, Coffee.  Family History  Alcohol Abuse Brother. Arthritis Brother, Mother. Cancer Mother. Hypertension Father. Seizure disorder Sister. Thyroid problems Mother. Colon Cancer Father. Colon Polyps Father. Heart Disease Father.  Vitals Weight: 356 lb Height: 72in Body Surface Area: 2.89 m Body Mass Index: 48.36 kg/m  Temp.: 98.43F(Oral)  BP: 132/84 (Sitting, Left Arm, Standard)   Physical Exam General Note: Obese white male in no acute distress Head-normocephalic ENT unremarkable Neck-post thyroidectomy, no bruits Chest clear to auscultation Heart SR without murmurs Abdomen obese and nontender GU not examined Ext- FROM without edema or clubbing Neuro alert and oriented x 3; motor and sensory function is intact  Assessment & Plan  MORBID OBESITY (278.01  E66.01) BMI 48 Seen in the office Jan 06, 2016.  UGI is normal.  Ready for lap sleeve gastrectomy.   Matt B. Hassell Done, MD, FACS

## 2016-01-12 NOTE — Patient Instructions (Addendum)
George Crane  01/12/2016   Your procedure is scheduled on: Monday January 17, 2016    Report to [facility]  Entrance take [facility]  elevators to 3rd floor to  [place] at    [place] AM.  Call this number if you have problems the morning of surgery [phone]   Remember: ONLY 1 PERSON MAY GO WITH YOU TO SHORT STAY TO GET  READY MORNING OF [place].  Do not eat food or drink liquids :After Midnight.     Take these medicines the morning of surgery with A SIP OF WATER: Levothyroxine, (Omeprazole) Prilosec                TAKE 1/2 EVENING DOSE OF INSULIN NIGHT PRIOR TO SURGERY              NO DIABETIC MEDICATIONS AM OF SURGERY.                                  You may not have any metal on your body including hair pins and              piercings  Do not wear jewelry, lotions, powders or colognes, deodorant                          Men may shave face and neck.   Do not bring valuables to the hospital. [place].  Contacts, dentures or bridgework may not be worn into surgery.  Leave suitcase in the car. After surgery it may be brought to your room.   _____________________________________________________________________             [facility] - Preparing for Surgery Before surgery, you can play an important role.  Because skin is not sterile, your skin needs to be as free of germs as possible.  You can reduce the number of germs on your skin by washing with CHG (chlorahexidine gluconate) soap before surgery.  CHG is an antiseptic cleaner which kills germs and bonds with the skin to continue killing germs even after washing. Please DO NOT use if you have an allergy to CHG or antibacterial soaps.  If your skin becomes reddened/irritated stop using the CHG and inform your nurse when you arrive at Short Stay. Do not shave (including legs and underarms) for at least 48 hours prior to the first CHG shower.   You may shave your face/neck. Please follow these instructions carefully:  1.  Shower with CHG Soap the night before surgery and the  morning of Surgery.  2.  If you choose to wash your hair, wash your hair first as usual with your  normal  shampoo.  3.  After you shampoo, rinse your hair and body thoroughly to remove the  shampoo.                           4.  Use CHG as you would any other liquid soap.  You can apply chg directly  to the skin and wash  Gently with a scrungie or clean washcloth.  5.  Apply the CHG Soap to your body ONLY FROM THE NECK DOWN.   Do not use on face/ open                           Wound or open sores. Avoid contact with eyes, ears mouth and genitals (private parts).                       Wash face,  Genitals (private parts) with your normal soap.             6.  Wash thoroughly, paying special attention to the area where your surgery  will be performed.  7.  Thoroughly rinse your body with warm water from the neck down.  8.  DO NOT shower/wash with your normal soap after using and rinsing off  the CHG Soap.                9.  Pat yourself dry with a clean towel.            10.  Wear clean pajamas.            11.  Place clean sheets on your bed the night of your first shower and do not  sleep with pets. Day of Surgery : Do not apply any lotions/deodorants the morning of surgery.  Please wear clean clothes to the hospital/surgery center.  FAILURE TO FOLLOW THESE INSTRUCTIONS MAY RESULT IN THE CANCELLATION OF YOUR SURGERY PATIENT SIGNATURE_________________________________  NURSE SIGNATURE__________________________________  ________________________________________________________________________

## 2016-01-14 ENCOUNTER — Encounter (HOSPITAL_COMMUNITY): Payer: Self-pay

## 2016-01-14 ENCOUNTER — Encounter (HOSPITAL_COMMUNITY)
Admission: RE | Admit: 2016-01-14 | Discharge: 2016-01-14 | Disposition: A | Discharge status: Home / Self Care | Payer: 59 | Source: Ambulatory Visit | Attending: Surgery | Admitting: Surgery

## 2016-01-14 HISTORY — DX: Disorder of thyroid, unspecified: E07.9

## 2016-01-14 HISTORY — DX: Obesity, unspecified: E66.9

## 2016-01-14 LAB — CBC WITH DIFFERENTIAL/PLATELET
BASOS ABS: 0.1 10*3/uL (ref 0.0–0.1)
BASOS PCT: 1 %
EOS ABS: 0.4 10*3/uL (ref 0.0–0.7)
EOS PCT: 4 %
HCT: 38 % — ABNORMAL LOW (ref 39.0–52.0)
HEMOGLOBIN: 13.3 g/dL (ref 13.0–17.0)
Lymphocytes Relative: 34 %
Lymphs Abs: 2.9 10*3/uL (ref 0.7–4.0)
MCH: 29.2 pg (ref 26.0–34.0)
MCHC: 35 g/dL (ref 30.0–36.0)
MCV: 83.3 fL (ref 78.0–100.0)
Monocytes Absolute: 0.7 10*3/uL (ref 0.1–1.0)
Monocytes Relative: 8 %
NEUTROS PCT: 53 %
Neutro Abs: 4.7 10*3/uL (ref 1.7–7.7)
PLATELETS: 252 10*3/uL (ref 150–400)
RBC: 4.56 MIL/uL (ref 4.22–5.81)
RDW: 13.8 % (ref 11.5–15.5)
WBC: 8.7 10*3/uL (ref 4.0–10.5)

## 2016-01-14 LAB — COMPREHENSIVE METABOLIC PANEL
ALBUMIN: 4.2 g/dL (ref 3.5–5.0)
ALK PHOS: 42 U/L (ref 38–126)
ALT: 45 U/L (ref 17–63)
AST: 38 U/L (ref 15–41)
Anion gap: 8 (ref 5–15)
BUN: 29 mg/dL — ABNORMAL HIGH (ref 6–20)
CALCIUM: 10.1 mg/dL (ref 8.9–10.3)
CHLORIDE: 106 mmol/L (ref 101–111)
CO2: 27 mmol/L (ref 22–32)
CREATININE: 1.29 mg/dL — AB (ref 0.61–1.24)
GFR calc non Af Amer: >60 mL/min (ref >60)
GLUCOSE: 94 mg/dL (ref 65–99)
Potassium: 4.5 mmol/L (ref 3.5–5.1)
SODIUM: 141 mmol/L (ref 135–145)
Total Bilirubin: 0.8 mg/dL (ref 0.3–1.2)
Total Protein: 8.2 g/dL — ABNORMAL HIGH (ref 6.5–8.1)

## 2016-01-14 MED FILL — oxyCODONE HCL 5 MG/5ML SOLN: 5 | 3 days supply | Qty: 200 | Fill #0

## 2016-01-14 NOTE — Progress Notes (Signed)
A1C results per chart 12/10/2015

## 2016-01-14 NOTE — Progress Notes (Addendum)
Spoke with portable equipment / Derek in regards to order for bari bed. Info provided.

## 2016-01-14 NOTE — Progress Notes (Signed)
CMP results in epic per PAT visit 01/14/2016 sent to Dr Hassell Done

## 2016-01-16 NOTE — Anesthesia Preprocedure Evaluation (Addendum)
Anesthesia Evaluation  Patient identified by MRN, date of birth, ID band Patient awake    Reviewed: Allergy & Precautions, NPO status , Patient's Chart, lab work & pertinent test results  History of Anesthesia Complications Negative for: history of anesthetic complications  Airway Mallampati: I  TM Distance: >3 FB Neck ROM: Full   Comment: Thick neck noted Dental  (+) Teeth Intact, Dental Advisory Given   Pulmonary neg pulmonary ROS,    breath sounds clear to auscultation       Cardiovascular hypertension,  Rhythm:Regular Rate:Normal     Neuro/Psych    GI/Hepatic negative GI ROS, Neg liver ROS,   Endo/Other  diabetesMorbid obesity  Renal/GU negative Renal ROS     Musculoskeletal   Abdominal (+) + obese,   Peds  Hematology negative hematology ROS (+)   Anesthesia Other Findings   Reproductive/Obstetrics                            Anesthesia Physical Anesthesia Plan  ASA: III  Anesthesia Plan: General   Post-op Pain Management:    Induction: Intravenous  Airway Management Planned: Oral ETT  Additional Equipment:   Intra-op Plan:   Post-operative Plan: Extubation in OR  Informed Consent: I have reviewed the patients History and Physical, chart, labs and discussed the procedure including the risks, benefits and alternatives for the proposed anesthesia with the patient or authorized representative who has indicated his/her understanding and acceptance.   Dental advisory given  Plan Discussed with: CRNA  Anesthesia Plan Comments:         Anesthesia Quick Evaluation

## 2016-01-17 ENCOUNTER — Encounter (HOSPITAL_COMMUNITY)
Admission: RE | Disposition: A | Discharge status: Home / Self Care | Payer: Self-pay | Source: Ambulatory Visit | Attending: Surgery

## 2016-01-17 ENCOUNTER — Encounter (HOSPITAL_COMMUNITY): Payer: Self-pay | Admitting: *Deleted

## 2016-01-17 ENCOUNTER — Inpatient Hospital Stay (HOSPITAL_COMMUNITY): Payer: 59 | Admitting: Anesthesiology

## 2016-01-17 ENCOUNTER — Inpatient Hospital Stay (HOSPITAL_COMMUNITY)
Admission: RE | Admit: 2016-01-17 | Discharge: 2016-01-18 | DRG: 621 | Disposition: A | Discharge status: Home / Self Care | Payer: 59 | Source: Ambulatory Visit | Attending: Surgery | Admitting: Surgery

## 2016-01-17 DIAGNOSIS — K429 Umbilical hernia without obstruction or gangrene: Secondary | ICD-10-CM | POA: Diagnosis present

## 2016-01-17 DIAGNOSIS — E119 Type 2 diabetes mellitus without complications: Secondary | ICD-10-CM | POA: Diagnosis present

## 2016-01-17 DIAGNOSIS — Z6841 Body Mass Index (BMI) 40.0 and over, adult: Secondary | ICD-10-CM | POA: Diagnosis not present

## 2016-01-17 DIAGNOSIS — K210 Gastro-esophageal reflux disease with esophagitis: Secondary | ICD-10-CM | POA: Diagnosis present

## 2016-01-17 DIAGNOSIS — Z9884 Bariatric surgery status: Secondary | ICD-10-CM

## 2016-01-17 DIAGNOSIS — K449 Diaphragmatic hernia without obstruction or gangrene: Secondary | ICD-10-CM | POA: Diagnosis present

## 2016-01-17 DIAGNOSIS — Z79899 Other long term (current) drug therapy: Secondary | ICD-10-CM

## 2016-01-17 DIAGNOSIS — E7800 Pure hypercholesterolemia, unspecified: Secondary | ICD-10-CM | POA: Diagnosis present

## 2016-01-17 DIAGNOSIS — Z7984 Long term (current) use of oral hypoglycemic drugs: Secondary | ICD-10-CM | POA: Diagnosis not present

## 2016-01-17 DIAGNOSIS — M1990 Unspecified osteoarthritis, unspecified site: Secondary | ICD-10-CM | POA: Diagnosis present

## 2016-01-17 DIAGNOSIS — G4730 Sleep apnea, unspecified: Secondary | ICD-10-CM | POA: Diagnosis present

## 2016-01-17 HISTORY — PX: LAPAROSCOPIC GASTRIC SLEEVE RESECTION WITH HIATAL HERNIA REPAIR: SHX6512

## 2016-01-17 LAB — GLUCOSE, CAPILLARY
GLUCOSE-CAPILLARY: 144 mg/dL — AB (ref 65–99)
Glucose-Capillary: 100 mg/dL — ABNORMAL HIGH (ref 65–99)

## 2016-01-17 LAB — CREATININE, SERUM
CREATININE: 1.3 mg/dL — AB (ref 0.61–1.24)
GFR calc non Af Amer: >60 mL/min (ref >60)

## 2016-01-17 LAB — CBC
HCT: 35.6 % — ABNORMAL LOW (ref 39.0–52.0)
Hemoglobin: 12.4 g/dL — ABNORMAL LOW (ref 13.0–17.0)
MCH: 28.8 pg (ref 26.0–34.0)
MCHC: 34.8 g/dL (ref 30.0–36.0)
MCV: 82.6 fL (ref 78.0–100.0)
PLATELETS: 245 10*3/uL (ref 150–400)
RBC: 4.31 MIL/uL (ref 4.22–5.81)
RDW: 13.8 % (ref 11.5–15.5)
WBC: 16.6 10*3/uL — AB (ref 4.0–10.5)

## 2016-01-17 SURGERY — GASTRECTOMY, SLEEVE, LAPAROSCOPIC, WITH HIATAL HERNIA REPAIR
Anesthesia: General | Site: Abdomen

## 2016-01-17 MED ORDER — CEFOTETAN DISODIUM-DEXTROSE 2-2.08 GM-% IV SOLR
2.0000 g | INTRAVENOUS | Status: AC
  Administered 2016-01-17: 2 g via INTRAVENOUS

## 2016-01-17 MED ORDER — APREPITANT 40 MG PO CAPS
40.0000 mg | ORAL_CAPSULE | ORAL | Status: AC
  Administered 2016-01-17: 40 mg via ORAL
  Filled 2016-01-17: qty 1

## 2016-01-17 MED ORDER — ORAL CARE MOUTH RINSE
15.0000 mL | Freq: Two times a day (BID) | OROMUCOSAL | Status: DC
  Administered 2016-01-17: 15 mL via OROMUCOSAL

## 2016-01-17 MED ORDER — SUGAMMADEX SODIUM 200 MG/2ML IV SOLN
INTRAVENOUS | Status: DC | PRN
  Administered 2016-01-17: 400 mg via INTRAVENOUS

## 2016-01-17 MED ORDER — AMLODIPINE BESYLATE 10 MG PO TABS
10.0000 mg | ORAL_TABLET | Freq: Once | ORAL | Status: AC
Start: 2016-01-17 — End: 2016-01-17
  Administered 2016-01-17: 10 mg via ORAL
  Filled 2016-01-17: qty 1

## 2016-01-17 MED ORDER — PROMETHAZINE HCL 25 MG/ML IJ SOLN
6.2500 mg | INTRAMUSCULAR | Status: DC | PRN
  Administered 2016-01-17: 6.25 mg via INTRAVENOUS

## 2016-01-17 MED ORDER — LIDOCAINE 2% (20 MG/ML) 5 ML SYRINGE
INTRAMUSCULAR | Status: DC | PRN
  Administered 2016-01-17: 80 mg via INTRAVENOUS

## 2016-01-17 MED ORDER — CEFOTETAN DISODIUM-DEXTROSE 2-2.08 GM-% IV SOLR
INTRAVENOUS | Status: AC
  Filled 2016-01-17: qty 50

## 2016-01-17 MED ORDER — ACETAMINOPHEN 160 MG/5ML PO SOLN: 650.0000 mg | ORAL | Status: DC | PRN

## 2016-01-17 MED ORDER — SUGAMMADEX SODIUM 500 MG/5ML IV SOLN
INTRAVENOUS | Status: AC
  Filled 2016-01-17: qty 5

## 2016-01-17 MED ORDER — EPHEDRINE 5 MG/ML INJ
INTRAVENOUS | Status: AC
  Filled 2016-01-17: qty 10

## 2016-01-17 MED ORDER — CHLORHEXIDINE GLUCONATE CLOTH 2 % EX PADS: 6.0000 | MEDICATED_PAD | Freq: Once | CUTANEOUS | Status: DC

## 2016-01-17 MED ORDER — SUCCINYLCHOLINE CHLORIDE 200 MG/10ML IV SOSY
PREFILLED_SYRINGE | INTRAVENOUS | Status: DC | PRN
  Administered 2016-01-17: 140 mg via INTRAVENOUS

## 2016-01-17 MED ORDER — HEPARIN SODIUM (PORCINE) 5000 UNIT/ML IJ SOLN
5000.0000 [IU] | INTRAMUSCULAR | Status: AC
  Administered 2016-01-17: 5000 [IU] via SUBCUTANEOUS
  Filled 2016-01-17: qty 1

## 2016-01-17 MED ORDER — KCL IN DEXTROSE-NACL 20-5-0.45 MEQ/L-%-% IV SOLN
INTRAVENOUS | Status: DC
  Administered 2016-01-17: 12:00:00 via INTRAVENOUS
  Administered 2016-01-18: 1000 mL via INTRAVENOUS
  Administered 2016-01-18: 100 mL/h via INTRAVENOUS
  Filled 2016-01-17 (×3): qty 1000

## 2016-01-17 MED ORDER — FENTANYL CITRATE (PF) 100 MCG/2ML IJ SOLN
INTRAMUSCULAR | Status: AC
  Filled 2016-01-17: qty 2

## 2016-01-17 MED ORDER — HEPARIN SODIUM (PORCINE) 5000 UNIT/ML IJ SOLN
5000.0000 [IU] | Freq: Three times a day (TID) | INTRAMUSCULAR | Status: DC
  Administered 2016-01-17 – 2016-01-18 (×3): 5000 [IU] via SUBCUTANEOUS
  Filled 2016-01-17 (×3): qty 1

## 2016-01-17 MED ORDER — PROPOFOL 10 MG/ML IV BOLUS
INTRAVENOUS | Status: DC | PRN
  Administered 2016-01-17: 50 mg via INTRAVENOUS
  Administered 2016-01-17: 250 mg via INTRAVENOUS

## 2016-01-17 MED ORDER — SUCCINYLCHOLINE CHLORIDE 20 MG/ML IJ SOLN
INTRAMUSCULAR | Status: AC
Start: 2016-01-17 — End: 2016-01-17
  Filled 2016-01-17: qty 1

## 2016-01-17 MED ORDER — SODIUM CHLORIDE 0.9 % IJ SOLN
INTRAMUSCULAR | Status: AC
  Filled 2016-01-17: qty 10

## 2016-01-17 MED ORDER — PROPOFOL 10 MG/ML IV BOLUS
INTRAVENOUS | Status: AC
  Filled 2016-01-17: qty 20

## 2016-01-17 MED ORDER — ONDANSETRON HCL 4 MG/2ML IJ SOLN
4.0000 mg | INTRAMUSCULAR | Status: DC | PRN
  Administered 2016-01-18: 4 mg via INTRAVENOUS
  Filled 2016-01-17: qty 2

## 2016-01-17 MED ORDER — ACETAMINOPHEN 160 MG/5ML PO SOLN: 325.0000 mg | ORAL | Status: DC | PRN

## 2016-01-17 MED ORDER — LIDOCAINE 2% (20 MG/ML) 5 ML SYRINGE
INTRAMUSCULAR | Status: AC
  Filled 2016-01-17: qty 5

## 2016-01-17 MED ORDER — HYDROMORPHONE HCL 1 MG/ML IJ SOLN
0.2500 mg | INTRAMUSCULAR | Status: DC | PRN
  Administered 2016-01-17 (×2): 0.5 mg via INTRAVENOUS

## 2016-01-17 MED ORDER — SODIUM CHLORIDE 0.9 % IJ SOLN
INTRAMUSCULAR | Status: DC | PRN
  Administered 2016-01-17: 10 mL

## 2016-01-17 MED ORDER — PROMETHAZINE HCL 25 MG/ML IJ SOLN
INTRAMUSCULAR | Status: AC
  Filled 2016-01-17: qty 1

## 2016-01-17 MED ORDER — ROCURONIUM BROMIDE 10 MG/ML (PF) SYRINGE
PREFILLED_SYRINGE | INTRAVENOUS | Status: DC | PRN
  Administered 2016-01-17 (×2): 10 mg via INTRAVENOUS
  Administered 2016-01-17: 40 mg via INTRAVENOUS
  Administered 2016-01-17: 10 mg via INTRAVENOUS
  Administered 2016-01-17: 20 mg via INTRAVENOUS
  Administered 2016-01-17: 10 mg via INTRAVENOUS

## 2016-01-17 MED ORDER — LACTATED RINGERS IR SOLN
Status: DC | PRN
  Administered 2016-01-17: 1000 mL

## 2016-01-17 MED ORDER — HYDROMORPHONE HCL 1 MG/ML IJ SOLN
INTRAMUSCULAR | Status: AC
  Filled 2016-01-17: qty 1

## 2016-01-17 MED ORDER — BUPIVACAINE LIPOSOME 1.3 % IJ SUSP
20.0000 mL | Freq: Once | INTRAMUSCULAR | Status: AC
  Administered 2016-01-17: 20 mL
  Filled 2016-01-17: qty 20

## 2016-01-17 MED ORDER — LACTATED RINGERS IV SOLN
INTRAVENOUS | Status: DC | PRN
  Administered 2016-01-17 (×3): via INTRAVENOUS

## 2016-01-17 MED ORDER — FENTANYL CITRATE (PF) 250 MCG/5ML IJ SOLN
INTRAMUSCULAR | Status: AC
  Filled 2016-01-17: qty 5

## 2016-01-17 MED ORDER — 0.9 % SODIUM CHLORIDE (POUR BTL) OPTIME
TOPICAL | Status: DC | PRN
  Administered 2016-01-17: 1000 mL

## 2016-01-17 MED ORDER — MIDAZOLAM HCL 2 MG/2ML IJ SOLN
INTRAMUSCULAR | Status: DC | PRN
  Administered 2016-01-17 (×2): 1 mg via INTRAVENOUS

## 2016-01-17 MED ORDER — EPHEDRINE SULFATE 50 MG/ML IJ SOLN
INTRAMUSCULAR | Status: DC | PRN
  Administered 2016-01-17: 10 mg via INTRAVENOUS
  Administered 2016-01-17: 5 mg via INTRAVENOUS
  Administered 2016-01-17: 10 mg via INTRAVENOUS

## 2016-01-17 MED ORDER — OXYCODONE HCL 5 MG/5ML PO SOLN: 5.0000 mg | ORAL | Status: DC | PRN

## 2016-01-17 MED ORDER — FAMOTIDINE IN NACL 20-0.9 MG/50ML-% IV SOLN
20.0000 mg | Freq: Two times a day (BID) | INTRAVENOUS | Status: DC
  Administered 2016-01-17 – 2016-01-18 (×2): 20 mg via INTRAVENOUS
  Filled 2016-01-17 (×2): qty 50

## 2016-01-17 MED ORDER — MIDAZOLAM HCL 2 MG/2ML IJ SOLN
INTRAMUSCULAR | Status: AC
  Filled 2016-01-17: qty 2

## 2016-01-17 MED ORDER — PREMIER PROTEIN SHAKE: 2.0000 [oz_av] | ORAL | Status: DC

## 2016-01-17 MED ORDER — ONDANSETRON HCL 4 MG/2ML IJ SOLN
INTRAMUSCULAR | Status: DC | PRN
  Administered 2016-01-17: 4 mg via INTRAVENOUS

## 2016-01-17 MED ORDER — ONDANSETRON HCL 4 MG/2ML IJ SOLN
INTRAMUSCULAR | Status: AC
  Filled 2016-01-17: qty 2

## 2016-01-17 MED ORDER — MORPHINE SULFATE (PF) 2 MG/ML IV SOLN
2.0000 mg | INTRAVENOUS | Status: DC | PRN
Start: 2016-01-17 — End: 2016-01-18
  Administered 2016-01-17: 2 mg via INTRAVENOUS
  Filled 2016-01-17 (×2): qty 1

## 2016-01-17 MED ORDER — FENTANYL CITRATE (PF) 250 MCG/5ML IJ SOLN
INTRAMUSCULAR | Status: DC | PRN
  Administered 2016-01-17: 25 ug via INTRAVENOUS
  Administered 2016-01-17 (×2): 50 ug via INTRAVENOUS
  Administered 2016-01-17: 25 ug via INTRAVENOUS
  Administered 2016-01-17 (×2): 50 ug via INTRAVENOUS
  Administered 2016-01-17: 25 ug via INTRAVENOUS
  Administered 2016-01-17: 50 ug via INTRAVENOUS

## 2016-01-17 MED ORDER — ROCURONIUM BROMIDE 50 MG/5ML IV SOSY
PREFILLED_SYRINGE | INTRAVENOUS | Status: AC
  Filled 2016-01-17: qty 10

## 2016-01-17 SURGICAL SUPPLY — 76 items
ADH SKN CLS APL DERMABOND .7 (GAUZE/BANDAGES/DRESSINGS) ×2
APL SRG 32X5 SNPLK LF DISP (MISCELLANEOUS)
APPLICATOR COTTON TIP 6IN STRL (MISCELLANEOUS) IMPLANT
APPLIER CLIP 5 13 M/L LIGAMAX5 (MISCELLANEOUS)
APPLIER CLIP ROT 10 11.4 M/L (STAPLE)
APPLIER CLIP ROT 13.4 12 LRG (CLIP)
APR CLP LRG 13.4X12 ROT 20 MLT (CLIP)
APR CLP MED LRG 11.4X10 (STAPLE)
APR CLP MED LRG 5 ANG JAW (MISCELLANEOUS)
BAG SPEC RTRVL LRG 6X4 10 (ENDOMECHANICALS) ×2
BLADE SURG 15 STRL LF DISP TIS (BLADE) ×4 IMPLANT
BLADE SURG 15 STRL SS (BLADE) ×8
CABLE HIGH FREQUENCY MONO STRZ (ELECTRODE) ×4 IMPLANT
CLIP APPLIE 5 13 M/L LIGAMAX5 (MISCELLANEOUS) IMPLANT
CLIP APPLIE ROT 10 11.4 M/L (STAPLE) IMPLANT
CLIP APPLIE ROT 13.4 12 LRG (CLIP) IMPLANT
COVER SURGICAL LIGHT HANDLE (MISCELLANEOUS) ×8 IMPLANT
DERMABOND ADVANCED (GAUZE/BANDAGES/DRESSINGS) ×2
DERMABOND ADVANCED .7 DNX12 (GAUZE/BANDAGES/DRESSINGS) ×1 IMPLANT
DEVICE SUT QUICK LOAD TK 5 (STAPLE) ×8 IMPLANT
DEVICE SUT TI-KNOT TK 5X26 (MISCELLANEOUS) ×2 IMPLANT
DEVICE SUTURE ENDOST 10MM (ENDOMECHANICALS) IMPLANT
DEVICE TI KNOT TK5 (MISCELLANEOUS) ×1
DEVICE TROCAR PUNCTURE CLOSURE (ENDOMECHANICALS) ×8 IMPLANT
DISSECTOR BLUNT TIP ENDO 5MM (MISCELLANEOUS) ×4 IMPLANT
ELECT REM PT RETURN 9FT ADLT (ELECTROSURGICAL) ×8
ELECTRODE REM PT RTRN 9FT ADLT (ELECTROSURGICAL) ×4 IMPLANT
GAUZE SPONGE 4X4 12PLY STRL (GAUZE/BANDAGES/DRESSINGS) ×3 IMPLANT
GLOVE BIOGEL M 8.0 STRL (GLOVE) ×8 IMPLANT
GOWN STRL REUS W/TWL XL LVL3 (GOWN DISPOSABLE) ×28 IMPLANT
HANDLE STAPLE EGIA 4 XL (STAPLE) ×8 IMPLANT
HOVERMATT SINGLE USE (MISCELLANEOUS) ×8 IMPLANT
IRRIG SUCT STRYKERFLOW 2 WTIP (MISCELLANEOUS) ×8
IRRIGATION SUCT STRKRFLW 2 WTP (MISCELLANEOUS) ×4 IMPLANT
KIT BASIN OR (CUSTOM PROCEDURE TRAY) ×8 IMPLANT
MARKER SKIN DUAL TIP RULER LAB (MISCELLANEOUS) ×8 IMPLANT
NDL SPNL 22GX3.5 QUINCKE BK (NEEDLE) ×2 IMPLANT
NEEDLE SPNL 22GX3.5 QUINCKE BK (NEEDLE) ×8 IMPLANT
PACK UNIVERSAL I (CUSTOM PROCEDURE TRAY) ×8 IMPLANT
POUCH SPECIMEN RETRIEVAL 10MM (ENDOMECHANICALS) ×3 IMPLANT
QUICK LOAD TK 5 (STAPLE) ×4
RELOAD ENDO STITCH (ENDOMECHANICALS) IMPLANT
RELOAD STAPLE 45 PURP MED/THCK (STAPLE) IMPLANT
RELOAD SUT TRIPLE-STITCH 2-0 (ENDOMECHANICALS) IMPLANT
RELOAD TRI 45 ART MED THCK BLK (STAPLE) ×11 IMPLANT
RELOAD TRI 45 ART MED THCK PUR (STAPLE) IMPLANT
RELOAD TRI 60 ART MED THCK BLK (STAPLE) ×8 IMPLANT
RELOAD TRI 60 ART MED THCK PUR (STAPLE) ×20 IMPLANT
SCISSORS LAP 5X45 EPIX DISP (ENDOMECHANICALS) IMPLANT
SEALANT SURGICAL APPL DUAL CAN (MISCELLANEOUS) IMPLANT
SET TRI-LUMEN FLTR TB AIRSEAL (TUBING) ×3 IMPLANT
SHEARS HARMONIC ACE PLUS 45CM (MISCELLANEOUS) ×8 IMPLANT
SLEEVE ADV FIXATION 5X100MM (TROCAR) ×16 IMPLANT
SLEEVE GASTRECTOMY 36FR VISIGI (MISCELLANEOUS) ×8 IMPLANT
SOLUTION ANTI FOG 6CC (MISCELLANEOUS) ×8 IMPLANT
SPONGE LAP 18X18 X RAY DECT (DISPOSABLE) ×8 IMPLANT
STAPLER VISISTAT 35W (STAPLE) ×4 IMPLANT
SUT MNCRL AB 4-0 PS2 18 (SUTURE) ×9 IMPLANT
SUT SURGIDAC NAB ES-9 0 48 120 (SUTURE) ×12 IMPLANT
SUT VIC AB 4-0 SH 18 (SUTURE) ×8 IMPLANT
SUT VICRYL 0 TIES 12 18 (SUTURE) ×8 IMPLANT
SYR 10ML ECCENTRIC (SYRINGE) ×8 IMPLANT
SYR 20CC LL (SYRINGE) ×8 IMPLANT
SYR 50ML LL SCALE MARK (SYRINGE) ×8 IMPLANT
TOWEL OR 17X26 10 PK STRL BLUE (TOWEL DISPOSABLE) ×12 IMPLANT
TOWEL OR NON WOVEN STRL DISP B (DISPOSABLE) ×8 IMPLANT
TRAY FOLEY W/METER SILVER 16FR (SET/KITS/TRAYS/PACK) IMPLANT
TROCAR ADV FIXATION 12X100MM (TROCAR) ×8 IMPLANT
TROCAR ADV FIXATION 5X100MM (TROCAR) ×8 IMPLANT
TROCAR BLADELESS 15MM (ENDOMECHANICALS) ×8 IMPLANT
TROCAR BLADELESS OPT 5 100 (ENDOMECHANICALS) ×8 IMPLANT
TUBE CALIBRATION LAPBAND (TUBING) IMPLANT
TUBING CONNECTING 10 (TUBING) ×6 IMPLANT
TUBING CONNECTING 10' (TUBING) ×2
TUBING ENDO SMARTCAP (MISCELLANEOUS) ×8 IMPLANT
TUBING INSUF HEATED (TUBING) ×8 IMPLANT

## 2016-01-17 NOTE — Op Note (Signed)
Surgeon: Kaylyn Lim, MD, FACS  Asst:  Gurney Maxin, MD, FACS  Anes:  General endotracheal  Procedure: Four suture repair of large hiatal hernia, Laparoscopic sleeve gastrectomy and upper endoscopy  Diagnosis: Morbid obesity  Complications: none  EBL:   28 cc  Description of Procedure:  The patient was take to OR 4 and given general anesthesia.  The abdomen was prepped with PCMX and draped sterilely.  A timeout was performed.  Access to the abdomen was achieved with a five mm Optiview through the left upper quadrant.  Following insufflation, the state of the abdomen was found to be very big with a ventral eventration above the umbilicus and there was a small umbilical hernia. The calibration tube was passed several times as we dissected the EG junction.  The balloon test was positive for hiatal hernia despite a normal UGI.  Four sutures were required to closure the curare and the final balloon test was negative.   The ViSiGi 36Fr tube was inserted to deflate the stomach and was pulled back into the esophagus.    The pylorus was identified and we measured 5 cm back and marked the antrum.  At that point we began dissection to take down the greater curvature of the stomach using the Harmonic scalpel.  This dissection was taken all the way up to the left crus.  Posterior attachments of the stomach were also taken down.    The ViSiGi tube was then passed into the antrum and suction applied so that it was snug along the lessor curvature.  The "crow's foot" or incisura was identified.  The sleeve gastrectomy was begun using the [employer] stapler beginning with a black 4.5 cm with TRS followed by a 6 cm black with tRS followed by multiple applications with purple with TRS.  When the sleeve was complete the tube was taken off suction and insufflated briefly.  The tube was withdrawn.  Upper endoscopy was then performed by Dr. Kieth Brightly.     The specimen was extracted through the 15 trocar  site and it was closed with a 0 vicryl with the Endoclose.  Wounds were infiltrated with Exparel and closed with 4-0 monocryl and Dermabond.    Matt B. Hassell Done, [place], [facility], [place]

## 2016-01-17 NOTE — Op Note (Signed)
Preoperative diagnosis: laparoscopic sleeve gastrectomy  Postoperative diagnosis: Same   Procedure: Upper endoscopy   Surgeon: Gurney Maxin, M.D.  Anesthesia: Gen.   Indications for procedure: This patient was undergoing a laparoscopic sleeve gastrectomy.   Description of procedure: The endoscopy was placed in the mouth and into the oropharynx and under endoscopic vision it was advanced to the esophagogastric junction. The pouch was insufflated and no bleeding or bubbles were seen. The GEJ was identified at 37cm from the teeth. Small amount of esophagitis was seen. No bleeding or leaks were detected. The scope was withdrawn without difficulty.   Gurney Maxin, M.D. General, Bariatric, & Minimally Invasive Surgery [facility] [place]

## 2016-01-17 NOTE — H&P (View-Only) (Signed)
Priest A. Georgann Housekeeper Location: [facility] Patient #: [id number] DOB: Jun 16, 1965 Married / Language: English / Race: White Male   History of Present Illness  The patient is a 50 year old male who presents for a sleeve gastrectomy.   He is attended one of our seminars and is interested in sleeve gastrectomy. He is a 50 year-old white male who is followed by Dr. Chalmers Cater. He underwent a total thyroidectomy for thyroid cancer 6 years ago by Dr. Ronnald Collum. At about the same time he was found to be diabetic. His other comorbidities include hyperlipidemia and hypertension. He's research in sleeve gastrectomy and as I said has been to our seminar presentation in pursuing this. I described this procedure to him in some detail and went over the risks and complications not limited to bleeding and leaks from the staple line.  He reports that he is has yearly scans and there is no evidence that he has any persistent thyroid cancer. He has tried numerous attempts at weight loss and has been unsuccessful at sustained weight loss.  He denies any history of deep venous thrombosis and has had no prior abdominal surgery. He does have obstructive sleep apnea and wears a mask. He does have symptomatic GERD effectively treated with Prilosec. UGI showed no hiatal hernia   Problem List/Past Medical   Other Problems High blood pressure Hypercholesterolemia Sleep Apnea Arthritis Diabetes Mellitus Gastroesophageal Reflux Disease Thyroid Cancer Thyroid Disease  Past Surgical History  Knee Surgery Bilateral. Thyroid Surgery  Diagnostic Studies History  Colonoscopy 1-5 years ago UGI no hiatal hernia  Allergies  No Known Drug Allergies  Medication History (Chemira Jones; 10/01/2014 9:39 AM) Amlodipine Besylate-Valsartan (10-320MG  Tablet, Oral) Active. Chlorthalidone (25MG  Tablet, Oral) Active. Fenofibric Acid (135MG  Capsule DR, Oral) Active. Lantus SoloStar (100UNIT/ML Soln Pen-inj,  Subcutaneous) Active. Levothyroxine Sodium (112MCG Tablet, Oral) Active. MetFORMIN HCl (1000MG  Tablet, Oral) Active. Omeprazole (20MG  Capsule DR, Oral) Active. Simvastatin (20MG  Tablet, Oral) Active. Uloric (40MG  Tablet, Oral) Active. Medications Reconciled  Social History No drug use Tobacco use Never smoker. Alcohol use Recently quit alcohol use. Caffeine use Carbonated beverages, Coffee.  Family History  Alcohol Abuse Brother. Arthritis Brother, Mother. Cancer Mother. Hypertension Father. Seizure disorder Sister. Thyroid problems Mother. Colon Cancer Father. Colon Polyps Father. Heart Disease Father.  Vitals Weight: 356 lb Height: 72in Body Surface Area: 2.89 m Body Mass Index: 48.36 kg/m  Temp.: 98.103F(Oral)  BP: 132/84 (Sitting, Left Arm, Standard)   Physical Exam General Note: Obese white male in no acute distress Head-normocephalic ENT unremarkable Neck-post thyroidectomy, no bruits Chest clear to auscultation Heart SR without murmurs Abdomen obese and nontender GU not examined Ext- FROM without edema or clubbing Neuro alert and oriented x 3; motor and sensory function is intact  Assessment & Plan  MORBID OBESITY (278.01  E66.01) BMI 48 Seen in the office Jan 06, 2016.  UGI is normal.  Ready for lap sleeve gastrectomy.   Matt B. Hassell Done, MD, FACS

## 2016-01-17 NOTE — Transfer of Care (Signed)
Immediate Anesthesia Transfer of Care Note  Patient: George Crane  Procedure(s) Performed: Procedure(s): LAPAROSCOPIC GASTRIC SLEEVE RESECTION, WITH HIATAL HERNIA REPAIR AND UPPER ENDOSCOPY (N/A)  Patient Location: PACU  Anesthesia Type:General  Level of Consciousness: awake and alert   Airway & Oxygen Therapy: Patient Spontanous Breathing and Patient connected to face mask oxygen  Post-op Assessment: Report given to RN and Post -op Vital signs reviewed and stable  Post vital signs: Reviewed and stable  Last Vitals:  Vitals:   01/17/16 0520 01/17/16 1030  BP: 136/66 (!) (P) 151/72  Pulse: 82 76  Resp: 18 19  Temp: 36.6 C (P) 36.7 C    Last Pain:  Vitals:   01/17/16 0520  TempSrc: Oral         Complications: No apparent anesthesia complications

## 2016-01-17 NOTE — Interval H&P Note (Signed)
History and Physical Interval Note:  01/17/2016 7:31 AM  George Crane  has presented today for surgery, with the diagnosis of Morbid Obesity, DM II, OSA, HTN, GERD,   The various methods of treatment have been discussed with the patient and family. After consideration of risks, benefits and other options for treatment, the patient has consented to  Procedure(s): LAPAROSCOPIC GASTRIC SLEEVE RESECTION, UPPER ENDO (N/A) as a surgical intervention .  The patient's history has been reviewed, patient examined, no change in status, stable for surgery.  I have reviewed the patient's chart and labs.  Questions were answered to the patient's satisfaction.     , B

## 2016-01-17 NOTE — Anesthesia Postprocedure Evaluation (Signed)
Anesthesia Post Note  Patient: George Crane  Procedure(s) Performed: Procedure(s) (LRB): LAPAROSCOPIC GASTRIC SLEEVE RESECTION, WITH HIATAL HERNIA REPAIR AND UPPER ENDOSCOPY (N/A)  Patient location during evaluation: PACU Anesthesia Type: General Level of consciousness: awake and alert Pain management: pain level controlled Vital Signs Assessment: post-procedure vital signs reviewed and stable Respiratory status: spontaneous breathing, nonlabored ventilation, respiratory function stable and patient connected to nasal cannula oxygen Cardiovascular status: blood pressure returned to baseline and stable Postop Assessment: no signs of nausea or vomiting Anesthetic complications: no    Last Vitals:  Vitals:   01/17/16 1326 01/17/16 1423  BP: 138/64 (!) 142/73  Pulse: 80 80  Resp: 18 18  Temp: 36.9 C 37 C    Last Pain:  Vitals:   01/17/16 1423  TempSrc: Oral  PainSc:                  ,JAMES TERRILL

## 2016-01-17 NOTE — Anesthesia Procedure Notes (Signed)
Procedure Name: Intubation Date/Time: 01/17/2016 7:25 AM Performed by: Cynda Familia Pre-anesthesia Checklist: Patient identified, Emergency Drugs available, Suction available and Patient being monitored Patient Re-evaluated:Patient Re-evaluated prior to inductionOxygen Delivery Method: Circle System Utilized Preoxygenation: Pre-oxygenation with 100% oxygen Intubation Type: IV induction, Rapid sequence and Cricoid Pressure applied Ventilation: Mask ventilation without difficulty Laryngoscope Size: Miller and 2 Grade View: Grade I Tube type: Oral Number of attempts: 1 Airway Equipment and Method: Stylet Placement Confirmation: ETT inserted through vocal cords under direct vision,  positive ETCO2 and breath sounds checked- equal and bilateral Secured at: 22 cm Tube secured with: Tape Dental Injury: Teeth and Oropharynx as per pre-operative assessment  Comments: Smooth IV induction [place]--- intubation AM CRNA atraumatic-- teeth as preop-- decay on front teeth-- bilat BS [place]--

## 2016-01-18 LAB — CBC WITH DIFFERENTIAL/PLATELET
BASOS PCT: 0 %
Basophils Absolute: 0 10*3/uL (ref 0.0–0.1)
EOS ABS: 0 10*3/uL (ref 0.0–0.7)
EOS PCT: 0 %
HCT: 36.1 % — ABNORMAL LOW (ref 39.0–52.0)
Hemoglobin: 12 g/dL — ABNORMAL LOW (ref 13.0–17.0)
Lymphocytes Relative: 11 %
Lymphs Abs: 1.3 10*3/uL (ref 0.7–4.0)
MCH: 28.5 pg (ref 26.0–34.0)
MCHC: 33.2 g/dL (ref 30.0–36.0)
MCV: 85.7 fL (ref 78.0–100.0)
MONO ABS: 1 10*3/uL (ref 0.1–1.0)
MONOS PCT: 9 %
NEUTROS PCT: 80 %
Neutro Abs: 8.8 10*3/uL — ABNORMAL HIGH (ref 1.7–7.7)
Platelets: 225 10*3/uL (ref 150–400)
RBC: 4.21 MIL/uL — ABNORMAL LOW (ref 4.22–5.81)
RDW: 14.4 % (ref 11.5–15.5)
WBC: 11.1 10*3/uL — ABNORMAL HIGH (ref 4.0–10.5)

## 2016-01-18 LAB — HEMOGLOBIN AND HEMATOCRIT, BLOOD
HEMATOCRIT: 35.8 % — AB (ref 39.0–52.0)
HEMOGLOBIN: 11.9 g/dL — AB (ref 13.0–17.0)

## 2016-01-18 MED ORDER — ONDANSETRON 4 MG PO TBDP: 4.0000 mg | ORAL_TABLET | Freq: Three times a day (TID) | ORAL | 0 refills | Status: DC | PRN

## 2016-01-18 NOTE — Plan of Care (Signed)
Problem: Food- and Nutrition-Related Knowledge Deficit (NB-1.1) Goal: Nutrition education Formal process to instruct or train a patient/client in a skill or to impart knowledge to help patients/clients voluntarily manage or modify food choices and eating behavior to maintain or improve health. Outcome: Completed/Met Date Met: 01/18/16 Nutrition Education Note  Received consult for diet education per DROP protocol.   Discussed 2 week post op diet with pt. Emphasized that liquids must be non carbonated, non caffeinated, and sugar free. Fluid goals discussed. Reviewed progression of diet to include soft proteins at 7-10 days post-op. Pt to follow up with outpatient bariatric RD for further diet progression after 2 weeks. Multivitamins and minerals also reviewed. Teach back method used, pt expressed understanding, expect good compliance.   Diet: First 2 Weeks  You will see the dietitian about two (2) weeks after your surgery. The dietitian will increase the types of foods you can eat if you are handling liquids well:  If you have severe vomiting or nausea and cannot handle clear liquids lasting longer than 1 day, call your surgeon  Protein Shake  Drink at least 2 ounces of shake 5-6 times per day  Each serving of protein shakes (usually 8 - 12 ounces) should have a minimum of:  15 grams of protein  And no more than 5 grams of carbohydrate  Goal for protein each day:  Men = 80 grams per day  Women = 60 grams per day  Protein powder may be added to fluids such as non-fat milk or Lactaid milk or Soy milk (limit to 35 grams added protein powder per serving)   Hydration  Slowly increase the amount of water and other clear liquids as tolerated (See Acceptable Fluids)  Slowly increase the amount of protein shake as tolerated  Sip fluids slowly and throughout the day  May use sugar substitutes in small amounts (no more than 6 - 8 packets per day; i.e. Splenda)   Fluid Goal  The first goal is to  drink at least 8 ounces of protein shake/drink per day (or as directed by the nutritionist); some examples of protein shakes are Syntrax Nectar, Adkins Advantage, EAS Edge HP, and Unjury. See handout from pre-op Bariatric Education Class:  Slowly increase the amount of protein shake you drink as tolerated  You may find it easier to slowly sip shakes throughout the day  It is important to get your proteins in first  Your fluid goal is to drink 64 - 100 ounces of fluid daily  It may take a few weeks to build up to this  32 oz (or more) should be clear liquids  And  32 oz (or more) should be full liquids (see below for examples)  Liquids should not contain sugar, caffeine, or carbonation   Clear Liquids:  Water or Sugar-free flavored water (i.e. Fruit H2O, Propel)  Decaffeinated coffee or tea (sugar-free)  Crystal Lite, Wyler's Lite, Minute Maid Lite  Sugar-free Jell-O  Bouillon or broth  Sugar-free Popsicle: *Less than 20 calories each; Limit 1 per day   Full Liquids:  Protein Shakes/Drinks + 2 choices per day of other full liquids  Full liquids must be:  No More Than 12 grams of Carbs per serving  No More Than 3 grams of Fat per serving  Strained low-fat cream soup  Non-Fat milk  Fat-free Lactaid Milk  Sugar-free yogurt (Dannon Lite & Fit, Greek yogurt)      , MS, RD, LDN Pager: [phone] After Hours Pager: [phone]    

## 2016-01-18 NOTE — Progress Notes (Signed)
Patient alert and oriented, Post op day 1.  Provided support and encouragement.  Encouraged pulmonary toilet, ambulation and small sips of liquids.  All questions answered.  Will continue to monitor. 

## 2016-01-18 NOTE — Progress Notes (Signed)
Patient alert and oriented, pain is controlled. Patient is tolerating fluids, advanced to protein shake today, patient is tolerating well.  Reviewed Gastric sleeve discharge instructions with patient and patient is able to articulate understanding.  Provided information on BELT program, Support Group and [facility] outpatient pharmacy. All questions answered, will continue to monitor.  

## 2016-01-18 NOTE — Discharge Instructions (Signed)

## 2016-01-18 NOTE — Discharge Summary (Signed)
Physician Discharge Summary  Patient ID: George Crane MRN: [id number] DOB/AGE: Aug 11, 1965 50 y.o.  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 Diets advanced and ready for discharge on the afternoon of PD 1.  Zofran given for nausea  Consults: none  Significant Diagnostic Studies: none    Discharge Exam: Blood pressure (!) 120/99, pulse 74, temperature 98.3 F (36.8 C), temperature source Oral, resp. rate 15, height 6' (1.829 m), weight (!) 160 kg (352 lb 12.8 oz), SpO2 96 %. Incisions OK.  Up walking about  Disposition:   Discharge Instructions    Ambulate hourly while awake    Complete by:  As directed    Call MD for:  difficulty breathing, headache or visual disturbances    Complete by:  As directed    Call MD for:  persistant dizziness or light-headedness    Complete by:  As directed    Call MD for:  persistant nausea and vomiting    Complete by:  As directed    Call MD for:  redness, tenderness, or signs of infection (pain, swelling, redness, odor or green/yellow discharge around incision site)    Complete by:  As directed    Call MD for:  severe uncontrolled pain    Complete by:  As directed    Call MD for:  temperature >101 F    Complete by:  As directed    Diet bariatric full liquid    Complete by:  As directed    Incentive spirometry    Complete by:  As directed    Perform hourly while awake       Medication List    TAKE these medications   amLODipine-valsartan 10-320 MG tablet Commonly known as:  EXFORGE Take 1 tablet by mouth daily. Notes to patient:  Monitor Blood Pressure  Daily and keep a log for primary care physician.  You may need to make changes to your medications with rapid weight loss.     chlorthalidone 25 MG tablet Commonly known as:  HYGROTON Take 25 mg by mouth daily. Notes to patient:  Monitor Blood Pressure Daily and keep a log for primary care physician.  Monitor for symptoms of dehydration.  You may need to make changes to your medications with rapid weight loss.     Fish Oil 1000 MG Caps Take 1,000 mg by mouth daily. 1 tab po qd   hydrochlorothiazide 25 MG tablet Commonly known as:  HYDRODIURIL Take 25 mg by mouth daily. Notes to patient:  Monitor Blood Pressure Daily and keep a log for primary care physician.  Monitor for symptoms of dehydration.  You may need to make changes to your medications with rapid weight loss.     insulin glargine 100 UNIT/ML injection Commonly known as:  LANTUS Inject 15 Units into the skin 3 (three) times daily. 30 units daily Notes to patient:  Monitor Blood Sugar Frequently and keep a log for primary care physician, you may need to adjust medication dosage with rapid weight loss.     levothyroxine 112 MCG tablet Commonly known as:  SYNTHROID, LEVOTHROID Take 224 mcg by mouth daily  before breakfast.   metformin 1000 MG (OSM) 24 hr tablet Commonly known as:  FORTAMET Take 1,000 mg by mouth 2 (two) times daily with a meal.   omeprazole 20 MG capsule Commonly known as:  PRILOSEC Take 20 mg by mouth daily.   ondansetron 4 MG disintegrating tablet Commonly known as:  ZOFRAN ODT Take 1 tablet (4 mg total) by mouth every 8 (eight) hours as needed for nausea or vomiting.   simvastatin 20 MG tablet Commonly known as:  ZOCOR Take 20 mg by mouth daily.   TRILIPIX 135 MG capsule Generic drug:  Choline Fenofibrate Take 135 mg by mouth daily.   ULORIC 40 MG tablet Generic drug:  febuxostat Take 40 mg by mouth daily.      Follow-up Information    Pedro Earls, MD. Go on 02/11/2016.   Specialty:   General Surgery Why:  at 3:30 PM for post-op check Contact information: [address] [place] [address] [phone]        , B, MD .   Specialty:  General Surgery Contact information: [place] [address] [phone]           Signed: Pedro Earls 01/18/2016, 3:05 PM

## 2016-02-01 ENCOUNTER — Encounter: Payer: 59 | Attending: Surgery | Admitting: Dietician

## 2016-02-01 DIAGNOSIS — E119 Type 2 diabetes mellitus without complications: Secondary | ICD-10-CM | POA: Insufficient documentation

## 2016-02-01 DIAGNOSIS — G4730 Sleep apnea, unspecified: Secondary | ICD-10-CM | POA: Insufficient documentation

## 2016-02-01 DIAGNOSIS — Z85850 Personal history of malignant neoplasm of thyroid: Secondary | ICD-10-CM | POA: Diagnosis not present

## 2016-02-01 DIAGNOSIS — E7800 Pure hypercholesterolemia, unspecified: Secondary | ICD-10-CM | POA: Diagnosis not present

## 2016-02-01 DIAGNOSIS — I10 Essential (primary) hypertension: Secondary | ICD-10-CM | POA: Diagnosis not present

## 2016-02-01 DIAGNOSIS — E079 Disorder of thyroid, unspecified: Secondary | ICD-10-CM | POA: Insufficient documentation

## 2016-02-01 DIAGNOSIS — K219 Gastro-esophageal reflux disease without esophagitis: Secondary | ICD-10-CM | POA: Insufficient documentation

## 2016-02-01 DIAGNOSIS — Z6841 Body Mass Index (BMI) 40.0 and over, adult: Secondary | ICD-10-CM | POA: Insufficient documentation

## 2016-02-01 NOTE — Progress Notes (Signed)
Bariatric Class:  Appt start time: 1530 end time:  1630.  2 Week Post-Operative Nutrition Class  Patient was seen on 02/01/2016 for Post-Operative Nutrition education at [facility].   Surgery date: 01/17/2016 Surgery type: Sleeve gastrectomy Start weight at [facility]: 359 lbs on 10/06/2014 Weight today: 328.6 lbs  Weight change: 28.6 lbs  TANITA  BODY COMP RESULTS  12/20/15 02/01/16   BMI (kg/m^2) 48.4 44.6   Fat Mass (lbs) 156.4 175.4   Fat Free Mass (lbs) 200.8 153.2   Total Body Water (lbs) 156 N/A   The following the learning objectives were met by the patient during this course:  Identifies Phase 3A (Soft, High Proteins) Dietary Goals and will begin from 2 weeks post-operatively to 2 months post-operatively  Identifies appropriate sources of fluids and proteins   States protein recommendations and appropriate sources post-operatively  Identifies the need for appropriate texture modifications, mastication, and bite sizes when consuming solids  Identifies appropriate multivitamin and calcium sources post-operatively  Describes the need for physical activity post-operatively and will follow MD recommendations  States when to call healthcare provider regarding medication questions or post-operative complications  Handouts given during class include:  Phase 3A: Soft, High Protein Diet Handout  Follow-Up Plan: Patient will follow-up at [facility] in 6 weeks for 2 month post-op nutrition visit for diet advancement per MD.

## 2016-03-14 ENCOUNTER — Encounter: Payer: 59 | Attending: Surgery | Admitting: Dietician

## 2016-03-14 DIAGNOSIS — G4730 Sleep apnea, unspecified: Secondary | ICD-10-CM | POA: Diagnosis not present

## 2016-03-14 DIAGNOSIS — Z85850 Personal history of malignant neoplasm of thyroid: Secondary | ICD-10-CM | POA: Insufficient documentation

## 2016-03-14 DIAGNOSIS — Z6841 Body Mass Index (BMI) 40.0 and over, adult: Secondary | ICD-10-CM | POA: Insufficient documentation

## 2016-03-14 DIAGNOSIS — E079 Disorder of thyroid, unspecified: Secondary | ICD-10-CM | POA: Insufficient documentation

## 2016-03-14 DIAGNOSIS — I10 Essential (primary) hypertension: Secondary | ICD-10-CM | POA: Insufficient documentation

## 2016-03-14 DIAGNOSIS — K219 Gastro-esophageal reflux disease without esophagitis: Secondary | ICD-10-CM | POA: Insufficient documentation

## 2016-03-14 DIAGNOSIS — E119 Type 2 diabetes mellitus without complications: Secondary | ICD-10-CM | POA: Insufficient documentation

## 2016-03-14 DIAGNOSIS — E7800 Pure hypercholesterolemia, unspecified: Secondary | ICD-10-CM | POA: Insufficient documentation

## 2016-03-14 NOTE — Progress Notes (Signed)
  Follow-up visit:  8 Weeks Post-Operative Sleeve Gastrectomy Surgery  Medical Nutrition Therapy:  Appt start time: [id number] end time:  0900.  Primary concerns today: Post-operative Bariatric Surgery Nutrition Management. Returns with a 33.2 lb weight loss in the past 6 weeks. No longer taking insulin. Testing blood sugar 3 x day and averaging in the 90's no matter what time of day. Having no issues tolerating foods.   Surgery date: 01/17/2016 Surgery type: Sleeve gastrectomy Start weight at [facility]: 359 lbs on 10/06/2014, 363 per patient  Weight today: 295.4 lbs  Weight change: 33.2 lbs Total weight loss: 63.6 lbs  TANITA  BODY COMP RESULTS  12/20/15 02/01/16 03/14/16   BMI (kg/m^2) 48.4 44.6 40.1   Fat Mass (lbs) 156.4 175.4 130.6   Fat Free Mass (lbs) 200.8 153.2 164.8   Total Body Water (lbs) 156 N/A 129.2    Preferred Learning Style:   No preference indicated   Learning Readiness:   Ready  24-hr recall: 430 Protein shake (21 g) B (730AM): 1 egg and 1/2 triple zero yogurt (14 g) Snk (AM): sf jello  L (PM): 1 oz [place] and jello or yogurt (7-14 g) Snk (PM): none or jello or 1-2 scoops whey powder protein shake  (21 g) D (PM): [place] or 1 oz chicken (7 g) Snk (PM): none or cheese (7 g)  Tries to get in 2 protein shakes per day   Fluid intake: 2 protein shakes, over 80 oz water  Estimated total protein intake: 77-91 g  Medications: see list  Supplementation: taking  CBG monitoring: 3 x day  Average CBG per patient:  in in the 90's Last patient reported A1c: 6.1% before surgery, will have one in Mar[place]  Using straws: No Drinking while eating: No Hair loss: No Carbonated beverages: No N/V/D/C: No Dumping syndrome: No  Recent physical activity:  Walking 2 x day for 30 minutes, will start BELT in January  Progress Towards Goal(s):  In progress.  Handouts given during visit include:  Phase 3B High Protein + Non Starchy Vegetables   Nutritional Diagnosis:  [place]-3.3  Overweight/obesity related to past poor dietary habits and physical inactivity as evidenced by patient w/ recent sleeve gastrectomy surgery following dietary guidelines for continued weight loss.    Intervention:  Nutrition counseling provided. Goals:  Follow Phase 3B: High Protein + Non-Starchy Vegetables  Eat 3-6 small meals/snacks, every 3-5 hrs  Increase lean protein foods to meet 80g goal  Increase fluid intake to 64oz +  Avoid drinking 15 minutes before, during and 30 minutes after eating  Aim for >30 min of physical activity daily  Teaching Method Utilized:  Visual Auditory Hands on  Barriers to learning/adherence to lifestyle change: none  Demonstrated degree of understanding via:  Teach Back   Monitoring/Evaluation:  Dietary intake, exercise, and body weight. Follow up in 2 months for 4 month post-op visit.

## 2016-03-14 NOTE — Patient Instructions (Addendum)
Goals:  Follow Phase 3B: High Protein + Non-Starchy Vegetables  Eat 3-6 small meals/snacks, every 3-5 hrs  Increase lean protein foods to meet 80g goal  Increase fluid intake to 64oz +  Avoid drinking 15 minutes before, during and 30 minutes after eating  Aim for >30 min of physical activity daily  Surgery date: 01/17/2016 Surgery type: Sleeve gastrectomy Start weight at [facility]: 359 lbs on 10/06/2014, 363 per patient  Weight today: 295.4 lbs  Weight change: 33.2 lbs Total weight loss: 63.6 lbs  TANITA  BODY COMP RESULTS  12/20/15 02/01/16 03/14/16   BMI (kg/m^2) 48.4 44.6 40.1   Fat Mass (lbs) 156.4 175.4 130.6   Fat Free Mass (lbs) 200.8 153.2 164.8   Total Body Water (lbs) 156 N/A 129.2

## 2016-03-21 IMAGING — US US ABDOMEN COMPLETE
1 series · 14 of 25 positions shown · non-contrast
Comparison: CT abdomen pelvis of 08/06/2009

CLINICAL DATA: Morbid obesity, bariatric workup

EXAM:
ABDOMEN ULTRASOUND COMPLETE

[Series 1: us abdomen complete · 0.24mm/px · 14 of 90 slices shown]
[im 1/90]
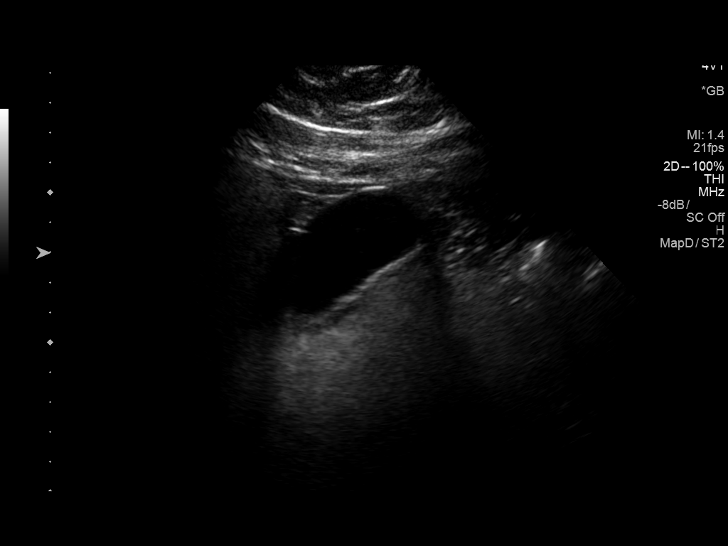
[im 8/90]
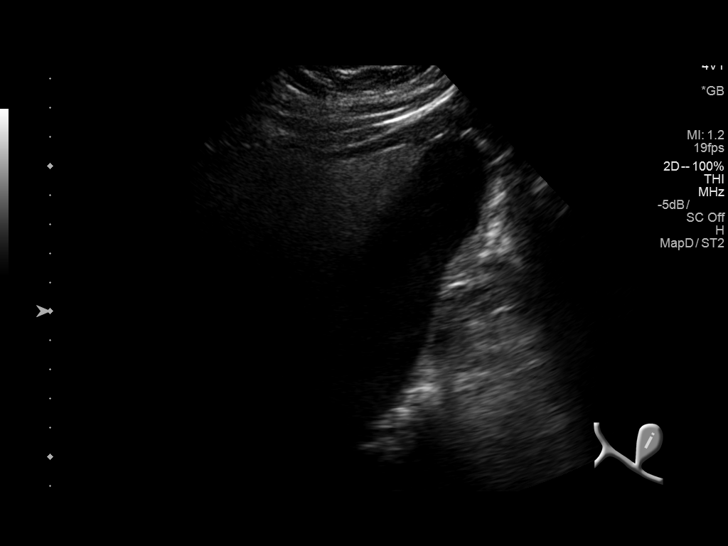
[im 15/90]
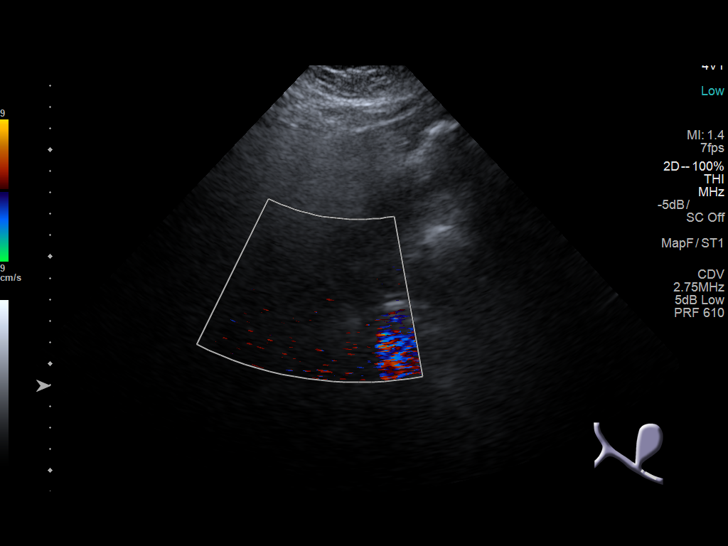
[im 23/90]
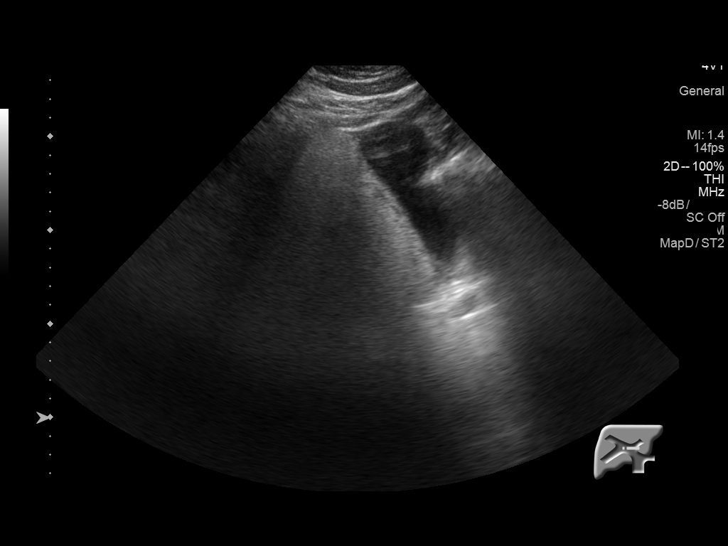
[im 30/90]
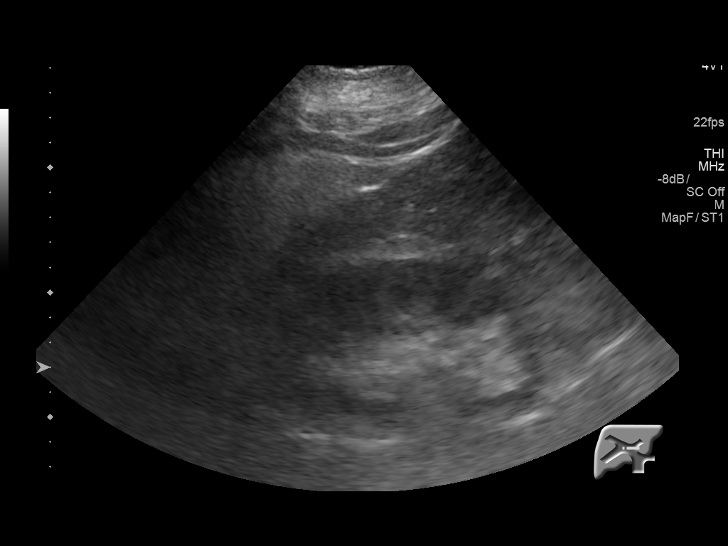
[im 34/90]
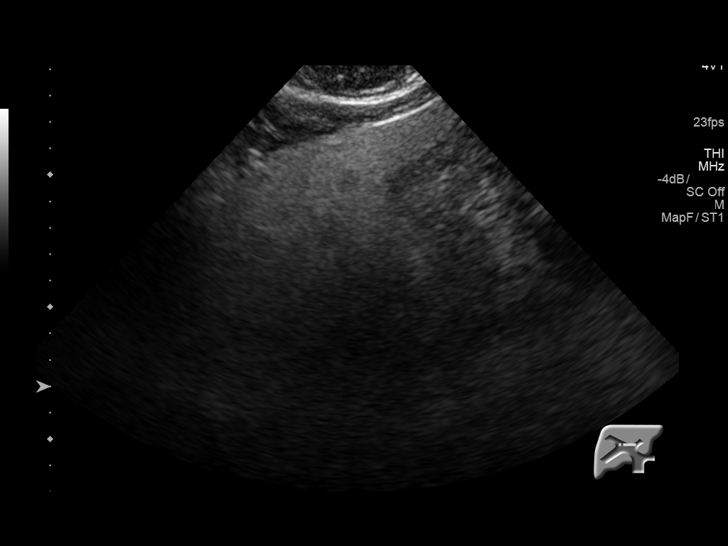
[im 41/90]
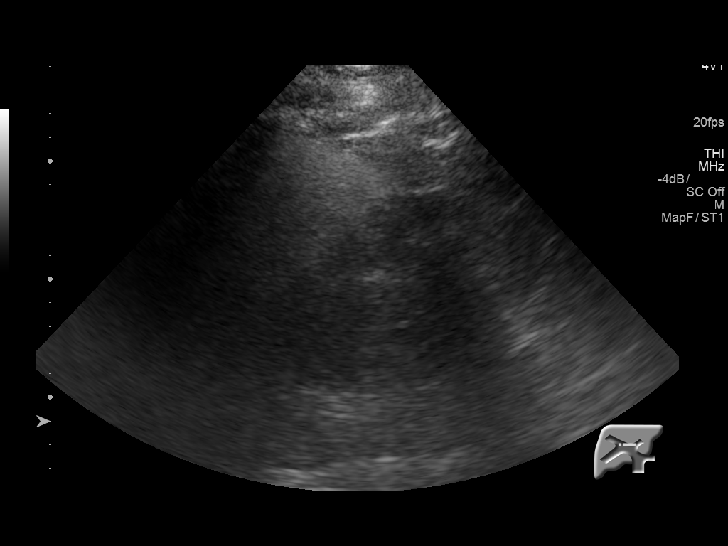
[im 49/90]
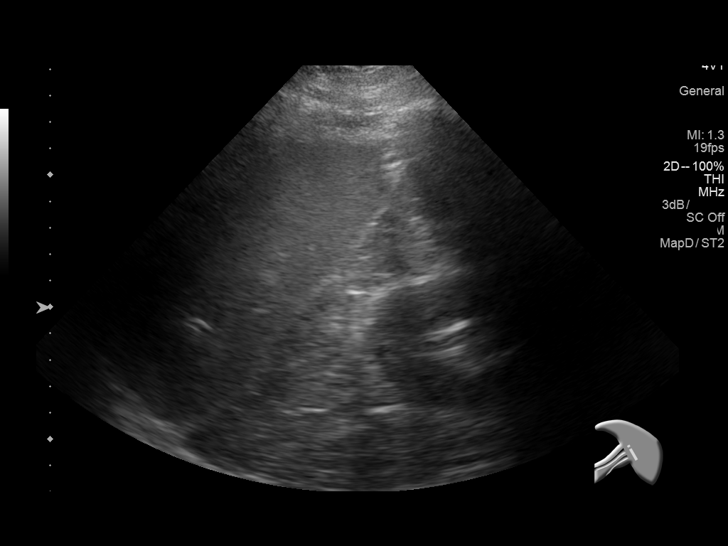
[im 56/90]
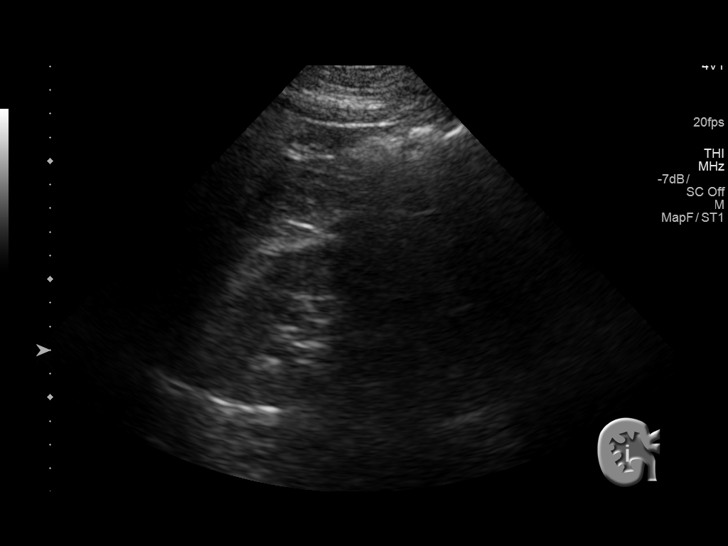
[im 60/90]
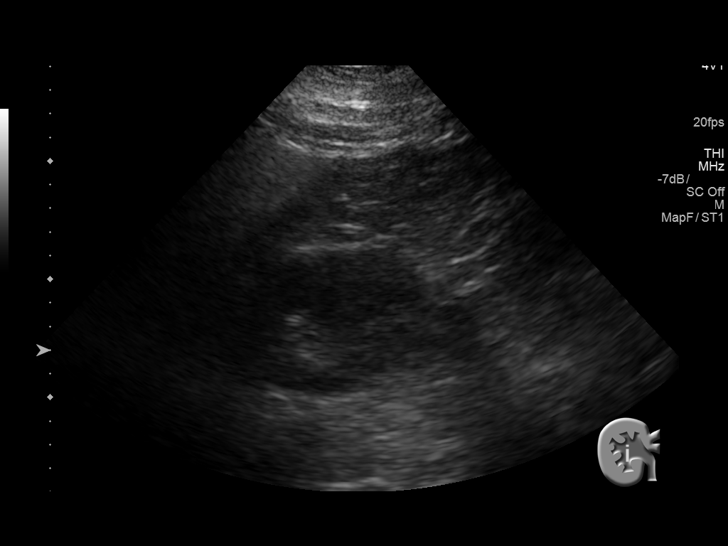
[im 67/90]
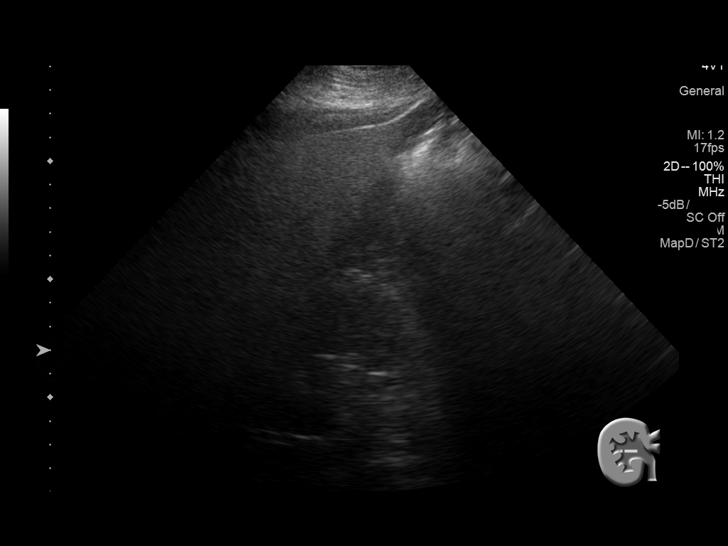
[im 75/90]
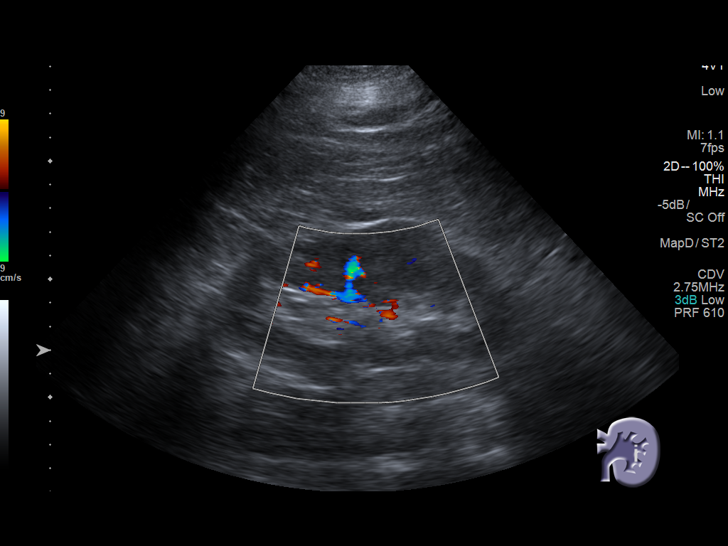
[im 82/90]
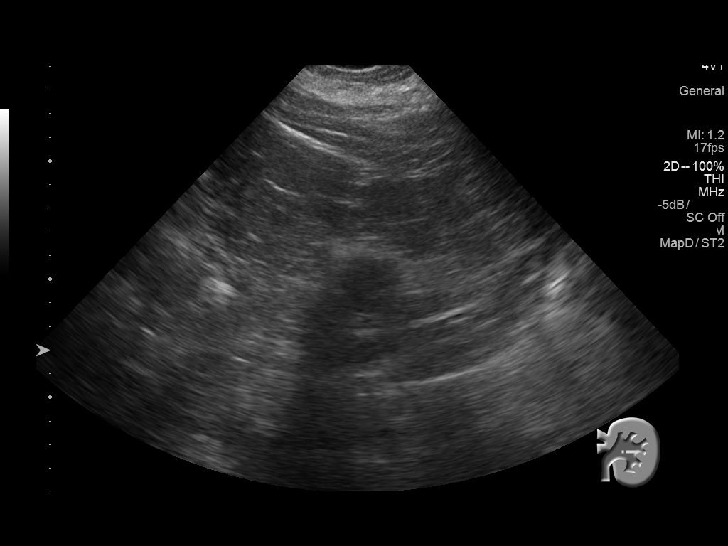
[im 90/90]
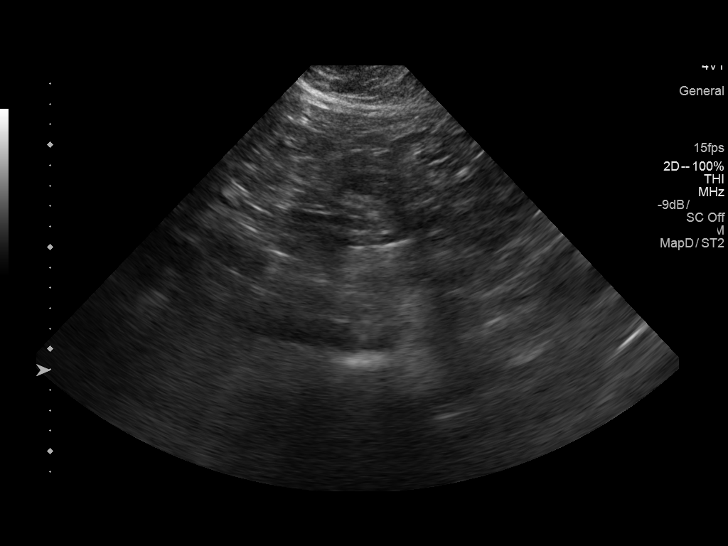

[14 of 25 positions shown; findings below may reference images not displayed]

FINDINGS: Gallbladder: The gallbladder is visualized and no gallstones are
noted. There is no pain over the gallbladder with compression.

Common bile duct: Diameter: The common bile duct measures 2.7 mm.

Liver: The liver is not visualized due to large patient body habitus
and bowel gas. The liver may be somewhat echogenic suggesting fatty
infiltration.

IVC: The IVC is obscured by bowel gas.

Pancreas: The pancreas also is completely obscured by bowel gas and
cannot be evaluated.

Spleen: The spleen measures 9.0 cm.

Right Kidney: Length: 13.0 cm..  No hydronephrosis is seen.

Left Kidney: Length: 14.0 cm..  No hydronephrosis is noted.

Abdominal aorta: The abdominal aorta is normal in caliber.

Other findings: This study is compromised by large patient body
habitus and bowel gas.
IMPRESSION: 1. No gallstones.  No ductal dilatation.
2. Much of the liver and pancreas are obscured by bowel gas. The
liver may be somewhat echogenic suggesting possible fatty
infiltration.
3. No hydronephrosis.

## 2016-05-16 ENCOUNTER — Encounter: Payer: Self-pay | Admitting: Skilled Nursing Facility1

## 2016-05-16 ENCOUNTER — Encounter: Payer: 59 | Attending: Surgery | Admitting: Skilled Nursing Facility1

## 2016-05-16 DIAGNOSIS — K219 Gastro-esophageal reflux disease without esophagitis: Secondary | ICD-10-CM | POA: Insufficient documentation

## 2016-05-16 DIAGNOSIS — E7800 Pure hypercholesterolemia, unspecified: Secondary | ICD-10-CM | POA: Diagnosis not present

## 2016-05-16 DIAGNOSIS — E079 Disorder of thyroid, unspecified: Secondary | ICD-10-CM | POA: Diagnosis not present

## 2016-05-16 DIAGNOSIS — Z85850 Personal history of malignant neoplasm of thyroid: Secondary | ICD-10-CM | POA: Diagnosis not present

## 2016-05-16 DIAGNOSIS — Z6841 Body Mass Index (BMI) 40.0 and over, adult: Secondary | ICD-10-CM | POA: Insufficient documentation

## 2016-05-16 DIAGNOSIS — E119 Type 2 diabetes mellitus without complications: Secondary | ICD-10-CM | POA: Diagnosis present

## 2016-05-16 DIAGNOSIS — I10 Essential (primary) hypertension: Secondary | ICD-10-CM | POA: Diagnosis not present

## 2016-05-16 DIAGNOSIS — G4730 Sleep apnea, unspecified: Secondary | ICD-10-CM | POA: Diagnosis not present

## 2016-05-16 NOTE — Progress Notes (Signed)
  Follow-up visit:  8 Weeks Post-Operative Sleeve Gastrectomy Surgery  Medical Nutrition Therapy:  Appt start time: [id number] end time:  0900.  Primary concerns today: Post-operative Bariatric Surgery Nutrition Management.  Pt states he does not eat any vegetables but he will think about adding them as snacks. Dietitian advised he try to wean off of the protein shakes and get his nutrients from whole foods. Pt states everything is going well and he has no concerns.   Surgery date: 01/17/2016 Surgery type: Sleeve gastrectomy Start weight at [facility]: 359 lbs on 10/06/2014, 363 per patient  Weight today: 262.2 lbs Weight change: 33.2 lbs  TANITA  BODY COMP RESULTS  12/20/15 02/01/16 03/14/16 05/16/2016   BMI (kg/m^2) 48.4 44.6 40.1 35.6   Fat Mass (lbs) 156.4 175.4 130.6 98   Fat Free Mass (lbs) 200.8 153.2 164.8 164.2   Total Body Water (lbs) 156 N/A 129.2 119.8    Preferred Learning Style:   No preference indicated   Learning Readiness:   Ready  24-hr recall: 430 Protein shake (21 g) B (730AM): 2 egg whites  (14 g) Snk (AM):protein shake L (PM): tuna or  (18 g) Snk (PM):  1-2 scoops whey powder protein shake  (21 g) D (PM):tuna or cream of chicken soup (18 g) Snk (PM): sf jello or yogurt  Tries to get in 2 protein shakes per day   Fluid intake: 2 protein shakes, over 80 oz water  Estimated total protein intake: 77-91 g  Medications: see list  Supplementation: multi and calcium every day  CBG monitoring: 3 x day  Average CBG per patient:  80-90 consistently Last patient reported A1c: 6.1% before surgery, will have one in [place]  Using straws: NO Drinking while eating: NO Hair loss: NO Carbonated beverages: NO N/V/D/C: NO,NO,NO,NO  Recent physical activity:  Starts BELT this week   Progress Towards Goal(s):  In progress.  Handouts given during visit include:  Phase 3B High Protein + Non Starchy Vegetables   Nutritional Diagnosis:  [place]-3.3 Overweight/obesity related to  past poor dietary habits and physical inactivity as evidenced by patient w/ recent sleeve gastrectomy surgery following dietary guidelines for continued weight loss.    Intervention:  Nutrition counseling provided. Add vegetables Wean off the protein shakes  Teaching Method Utilized:  Visual Auditory Hands on  Barriers to learning/adherence to lifestyle change: none  Demonstrated degree of understanding via:  Teach Back   Monitoring/Evaluation:  Dietary intake, exercise, and body weight. Follow up in 2 months for 4 month post-op visit.

## 2016-08-15 ENCOUNTER — Encounter: Payer: Self-pay | Admitting: Skilled Nursing Facility1

## 2016-08-15 ENCOUNTER — Encounter: Payer: 59 | Attending: Surgery | Admitting: Skilled Nursing Facility1

## 2016-08-15 DIAGNOSIS — Z6831 Body mass index (BMI) 31.0-31.9, adult: Secondary | ICD-10-CM | POA: Insufficient documentation

## 2016-08-15 DIAGNOSIS — Z713 Dietary counseling and surveillance: Secondary | ICD-10-CM | POA: Insufficient documentation

## 2016-08-15 DIAGNOSIS — E119 Type 2 diabetes mellitus without complications: Secondary | ICD-10-CM

## 2016-08-15 NOTE — Patient Instructions (Addendum)
-  Weigh your meats after you have cooked them  -Aim for 3-4 ounces of protein every time you eat

## 2016-08-15 NOTE — Progress Notes (Signed)
  Follow-up visit:  8 Weeks Post-Operative Sleeve Gastrectomy Surgery  Medical Nutrition Therapy:  Appt start time: 3888 end time:  0900.  Primary concerns today: Post-operative Bariatric Surgery Nutrition Management.  Pt states he works about 10-12 hours every day. Pt states his energy level has been great.   Surgery date: 01/17/2016 Surgery type: Sleeve gastrectomy Start weight at [facility]: 359 lbs on 10/06/2014, 363 per patient  Weight today: 230.8 lbs Weight change: 31.4 lbs  TANITA  BODY COMP RESULTS  12/20/15 02/01/16 03/14/16 05/16/2016 05/15/20158   BMI (kg/m^2) 48.4 44.6 40.1 35.6 31.3   Fat Mass (lbs) 156.4 175.4 130.6 98 58   Fat Free Mass (lbs) 200.8 153.2 164.8 164.2 172.8   Total Body Water (lbs) 156 N/A 129.2 119.8 118.6    Preferred Learning Style:   No preference indicated   Learning Readiness:   Ready  24-hr recall: 430 Protein shake (40 g) B (730AM): yogurt (14 g) Snk (AM):protein shake L (PM): tuna and green beans (17 g) Snk (PM):  Fruit  D (PM):tuna or cream of chicken soup (7 g) Snk (PM): sf jello or yogurt  Tries to get in 2 protein shakes per day   Fluid intake: 1 protein shakes, over 80 oz water  Estimated total protein intake: 78 g  Medications: no longer taking any medications for diabetes  Supplementation: multi and calcium every day  CBG monitoring: 3 x day  Average CBG per patient:  80-88 consistently Last patient reported A1c: 6.1% before surgery, will have one in [place]  Using straws: NO Drinking while eating: NO Hair loss: NO Carbonated beverages: NO N/V/D/C: NO,NO,NO,NO Chewing/swallowing difficulties: no Changes in vision: no Changes to mood/headaches: no Hair loss/Cahnges to skin/Changes to nails: no Any difficulty focusing or concentrating: no Sweating: no Dizziness/Lightheaded: no Palpitations: no Carbonated beverages: no Abdominal Pain: no  Recent physical activity: BELT   Progress Towards Goal(s):  In  progress.  Handouts given during visit include:  Phase 3B High Protein + Non Starchy Vegetables   Nutritional Diagnosis:  [place]-3.3 Overweight/obesity related to past poor dietary habits and physical inactivity as evidenced by patient w/ recent sleeve gastrectomy surgery following dietary guidelines for continued weight loss.    Intervention:  Nutrition counseling provided. -Weigh your meats after you have cooked them -Aim for 3-4 ounces of protein every time you eat  Teaching Method Utilized:  Visual Auditory Hands on  Barriers to learning/adherence to lifestyle change: none  Demonstrated degree of understanding via:  Teach Back   Monitoring/Evaluation:  Dietary intake, exercise, and body weight. Follow up in 2 months for 4 month post-op visit.

## 2016-12-19 ENCOUNTER — Encounter: Payer: 59 | Attending: Surgery | Admitting: Skilled Nursing Facility1

## 2016-12-19 DIAGNOSIS — E119 Type 2 diabetes mellitus without complications: Secondary | ICD-10-CM

## 2016-12-19 DIAGNOSIS — Z713 Dietary counseling and surveillance: Secondary | ICD-10-CM | POA: Insufficient documentation

## 2016-12-19 NOTE — Progress Notes (Signed)
Post-Operative Sleeve Gastrectomy Surgery  Medical Nutrition Therapy:  Appt start time: 9449 end time:  0900.  Primary concerns today: Post-operative Bariatric Surgery Nutrition Management.  Pt states he works about 10-12 hours every day. Pt states his energy level has been great.  Pts medications omeprazole every day, potassium, simvastatin, levothyroxine. Pt states he has reached his goal which was put his diabetes in remission and feels amazing!  Surgery date: 01/17/2016 Surgery type: Sleeve gastrectomy Start weight at [facility]: 359 lbs on 10/06/2014, 363 per patient  Weight today: 218 lbs Weight change: 12 lbs  TANITA  BODY COMP RESULTS  12/20/15 02/01/16 03/14/16 05/16/2016 05/15/20158 12/19/2016   BMI (kg/m^2) 48.4 44.6 40.1 35.6 31.3 29.6   Fat Mass (lbs) 156.4 175.4 130.6 98 58 53.6   Fat Free Mass (lbs) 200.8 153.2 164.8 164.2 172.8 164.4   Total Body Water (lbs) 156 N/A 129.2 119.8 118.6 112.2    24-hr recall: half protein shake before and after working out 430 Protein shake (40 g) B (730AM): yogurt (14 g) Snk (AM):protein shake L (PM): tuna and green beans (17 g) Snk (PM):  Fruit almonds or cheese D (PM):tuna or cream of chicken soup (7 g) Snk (PM): sf jello or yogurt  Tries to get in 2 protein shakes per day   Fluid intake: 1 protein shakes, over 96 oz water sometimes milk  Estimated total protein intake: 78 g  Medications: no longer taking any medications for diabetes  Supplementation: multi and calcium every day  CBG monitoring: not checking Average CBG per patient:  80-88 consistently Last patient reported A1c: 6.1% before surgery, will have one in [place]  Using straws: NO Drinking while eating: NO Hair loss: NO Carbonated beverages: NO N/V/D/C: NO,NO,NO,NO Chewing/swallowing difficulties: no Changes in vision: no Changes to mood/headaches: no Hair loss/Cahnges to skin/Changes to nails: no Any difficulty focusing or concentrating: no Sweating:  no Dizziness/Lightheaded: no Palpitations: no Carbonated beverages: no Abdominal Pain: no  Recent physical activity: HOPE   Progress Towards Goal(s):  In progress.   Nutritional Diagnosis:  -3.3 Overweight/obesity related to past poor dietary habits and physical inactivity as evidenced by patient w/ recent sleeve gastrectomy surgery following dietary guidelines for continued weight loss.    Intervention:  Nutrition counseling provided. Goals: -Try some vegetables in between meals -Try some fruit with your protein shake after working out  Teaching Method Utilized:  Visual Auditory Hands on  Barriers to learning/adherence to lifestyle change: none  Demonstrated degree of understanding via:  Teach Back   Monitoring/Evaluation:  Dietary intake, exercise, and body weight.

## 2016-12-19 NOTE — Patient Instructions (Addendum)
-  Try some vegetables in between meals  -Try some fruit with your protein shake after working out

## 2016-12-21 ENCOUNTER — Encounter (INDEPENDENT_AMBULATORY_CARE_PROVIDER_SITE_OTHER): Payer: Self-pay | Admitting: *Deleted

## 2017-05-22 ENCOUNTER — Ambulatory Visit: Payer: 59 | Admitting: Skilled Nursing Facility1

## 2017-09-22 IMAGING — RF DG UGI W/ KUB
10 of 11 series · 15 of 20 positions shown · non-contrast
Comparison: CT 08/06/2000 11

CLINICAL DATA: Preop bariatric surgery

EXAM:
UPPER GI SERIES WITH KUB
TECHNIQUE: After obtaining a scout radiograph a routine upper GI series was
performed using high-density
FLUOROSCOPY TIME:  Radiation Exposure Index (as provided by the
fluoroscopic device): 161.4 mGy
If the device does not provide the exposure index:
Fluoroscopy Time (in minutes and seconds):  1 minutes
Number of Acquired Images:

[Series 1: t abdomen supine · 0.15mm/px · 1 of 1 slices shown]
[im 1/1]
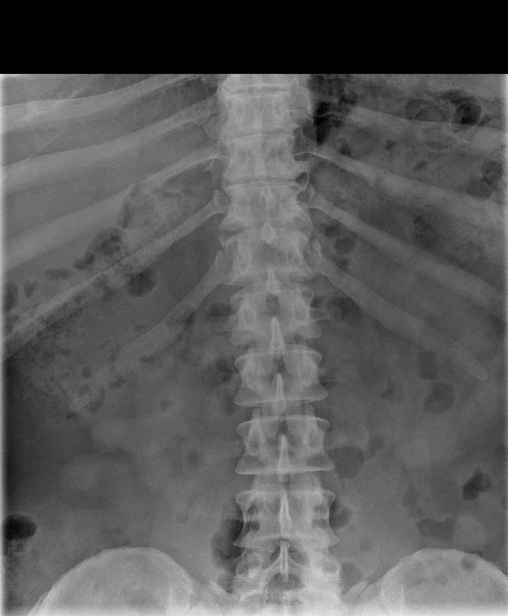

[Series 3: fluoro_barium 2fps_bw · 0.17mm/px · 2 of 2 frames shown (1 of 9)]
[frame 1/2]
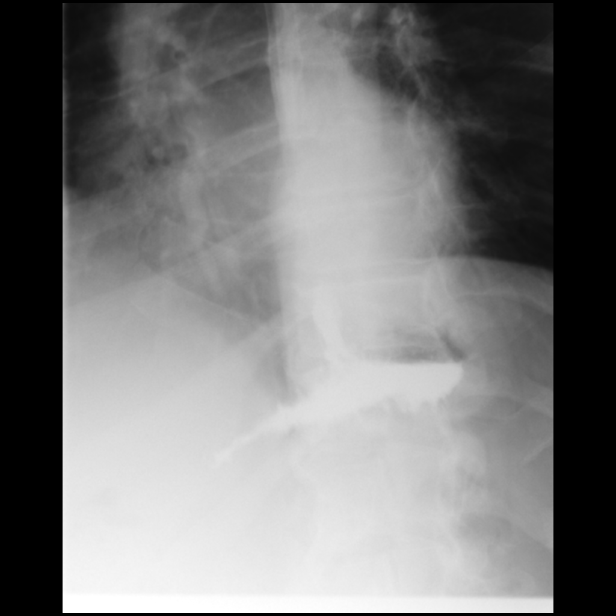
[frame 2/2]
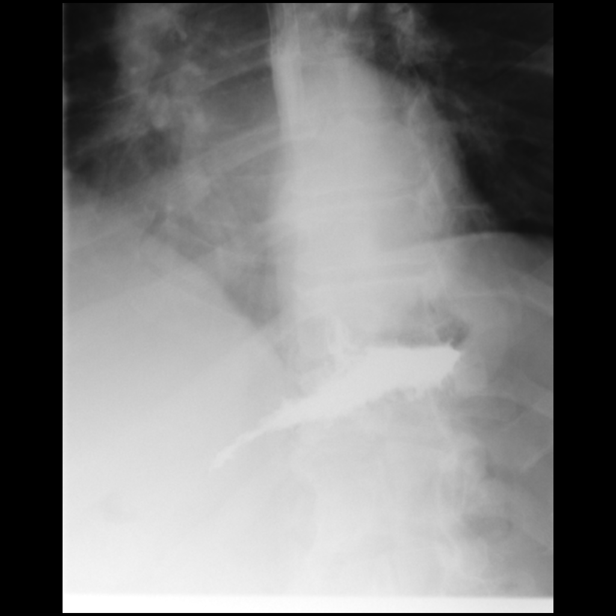

[Series 4: fluoro_barium 2fps_bw · 0.17mm/px · 1 of 2 frames shown (2 of 9)]
[frame 1/2]
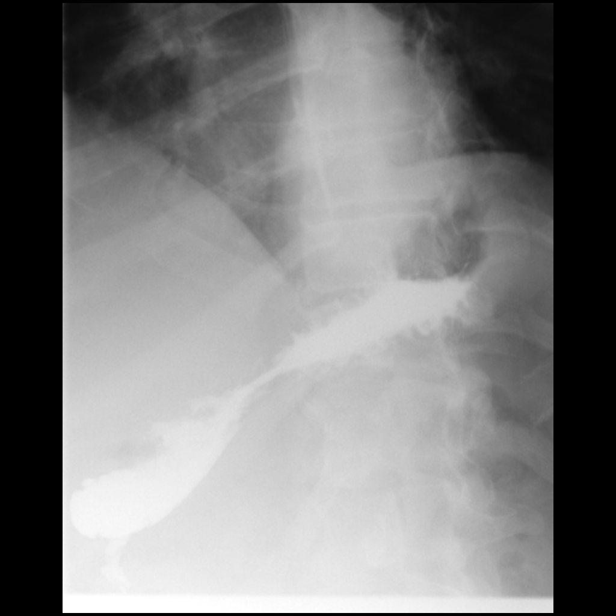

[Series 5: fluoro_barium 2fps_bw · 0.17mm/px · 2 of 2 frames shown (3 of 9)]
[frame 1/2]
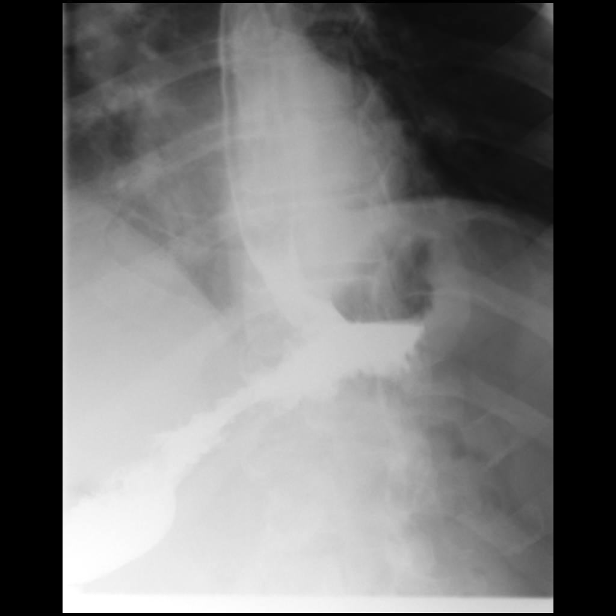
[frame 2/2]
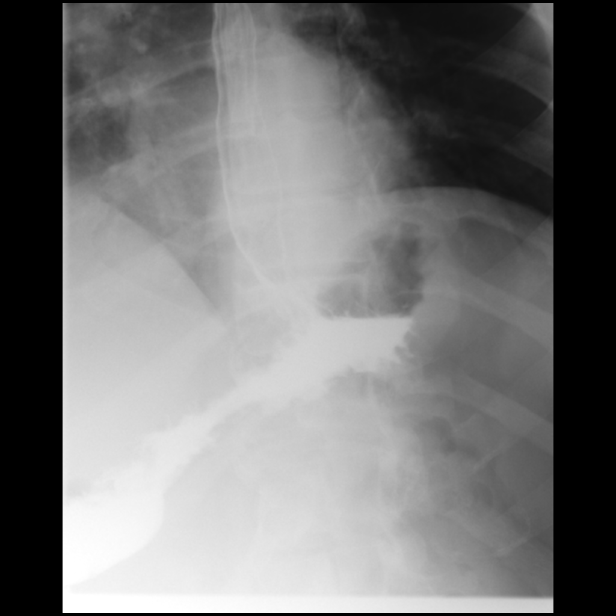

[Series 6: fluoro_barium 2fps_bw · 0.17mm/px · 1 of 2 frames shown (4 of 9)]
[frame 1/2]
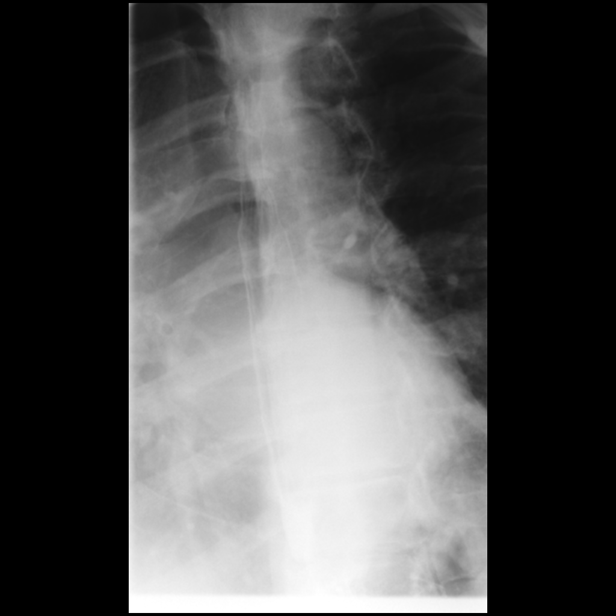

[Series 7: fluoro_barium 2fps_bw · 0.17mm/px · 1 of 1 slices shown (5 of 9)]
[im 1/1]
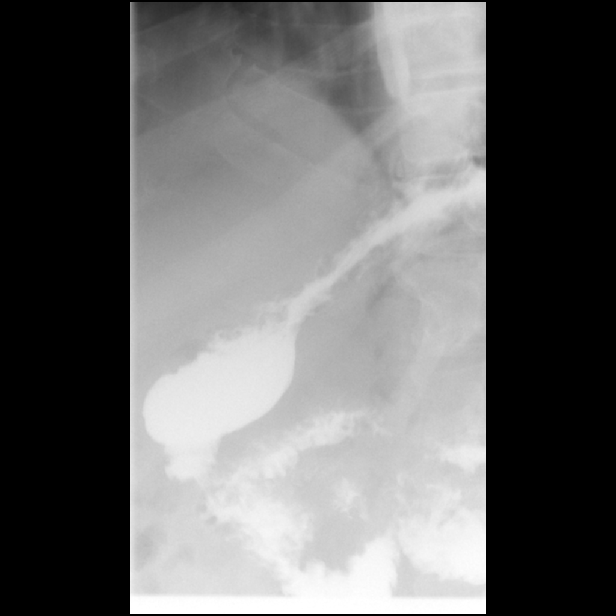

[Series 8: fluoro_barium 2fps_bw · 0.17mm/px · 2 of 2 frames shown (6 of 9)]
[frame 1/2]
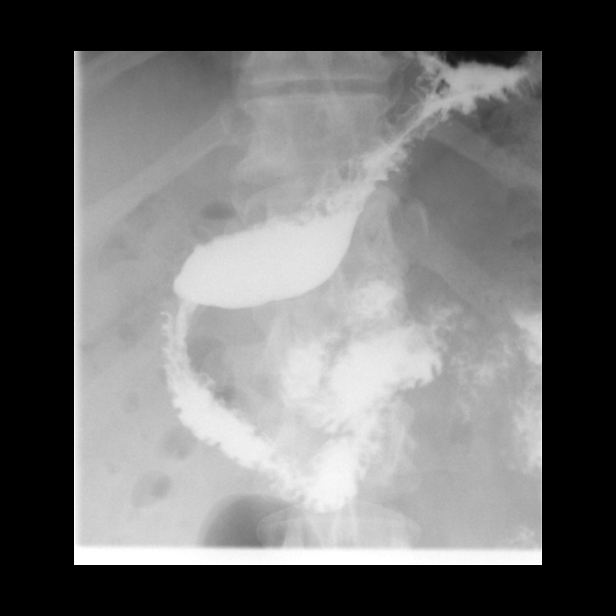
[frame 2/2]
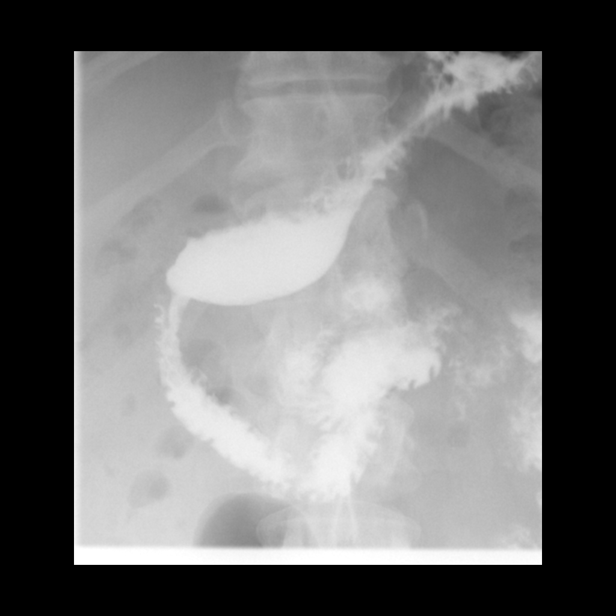

[Series 9: fluoro_barium 2fps_bw · 0.17mm/px · 2 of 3 frames shown (7 of 9)]
[frame 2/3]
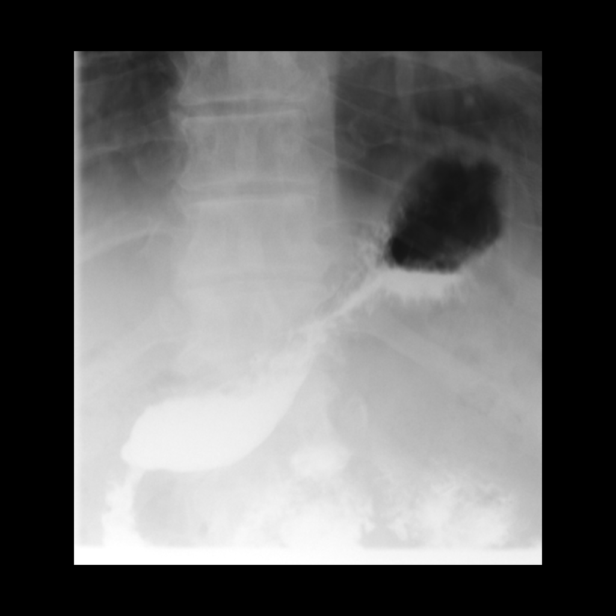
[frame 3/3]
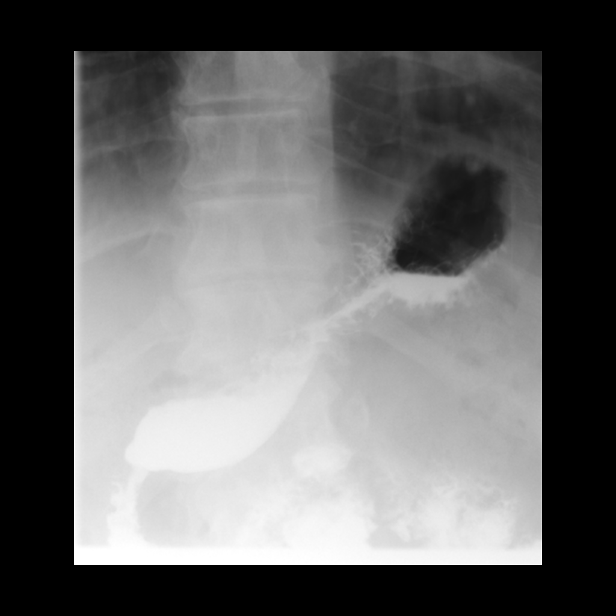

[Series 10: fluoro_barium 2fps_bw · 0.17mm/px · 1 of 2 frames shown (8 of 9)]
[frame 1/2]
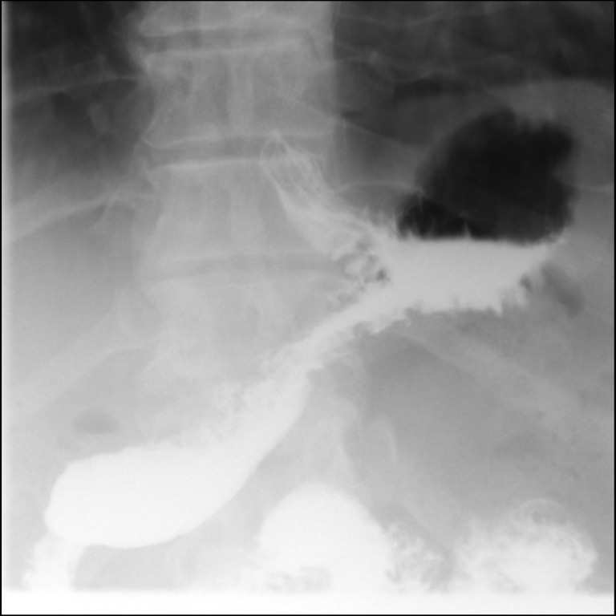

[Series 11: fluoro_barium 2fps_bw · 0.17mm/px · 2 of 2 frames shown (9 of 9)]
[frame 1/2]
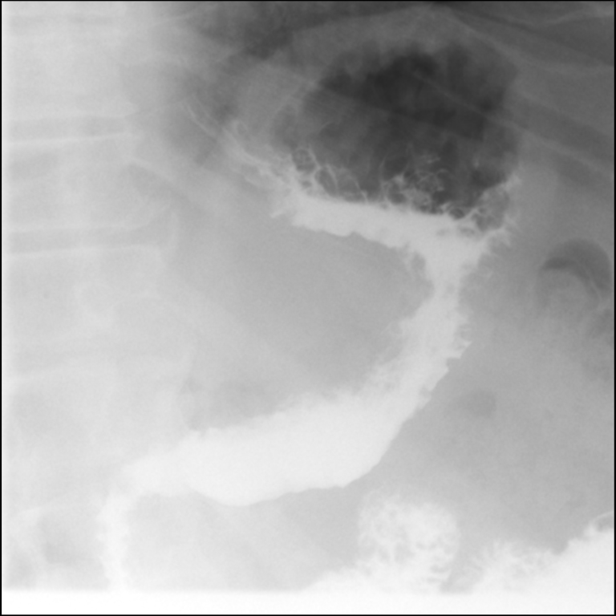
[frame 2/2]
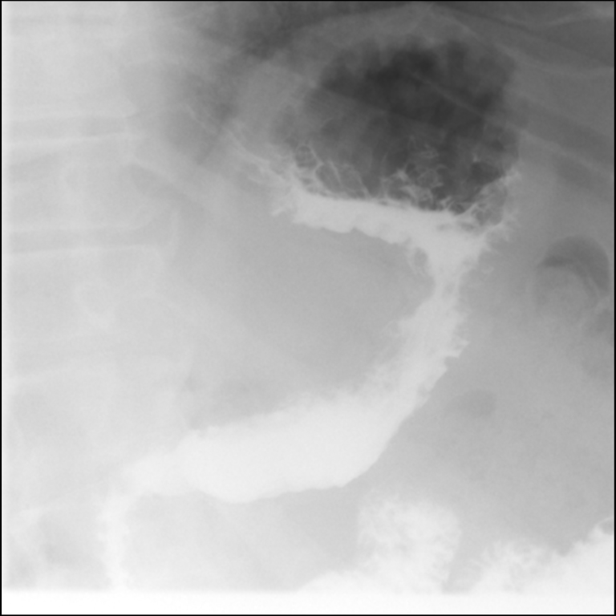

[15 of 20 positions shown; findings below may reference images not displayed]

FINDINGS: Initial KUB demonstrates normal bowel gas pattern. Patient ingested
oral contrast. The esophagus is normal. No hiatal hernia
demonstrated. Contrast flows readily into the stomach which ahs
normal orientation. No obstruction or mass. Normal C-loop of the
duodenum. The ligament Treitz expected location LEFT of the spine.
IMPRESSION: Normal anatomy of the esophagus, stomach, and duodenum

## 2019-07-23 ENCOUNTER — Encounter (HOSPITAL_COMMUNITY): Payer: Self-pay

## 2020-08-12 ENCOUNTER — Encounter (HOSPITAL_COMMUNITY): Payer: Self-pay | Admitting: *Deleted

## 2021-08-04 ENCOUNTER — Encounter (HOSPITAL_COMMUNITY): Payer: Self-pay | Admitting: *Deleted

## 2022-10-25 ENCOUNTER — Inpatient Hospital Stay
Admission: RE | Admit: 2022-10-25 | Discharge: 2022-10-25 | Disposition: A | Discharge status: Home / Self Care | Payer: Self-pay | Source: Ambulatory Visit | Attending: Physician Assistant | Admitting: Physician Assistant

## 2022-10-25 ENCOUNTER — Other Ambulatory Visit: Payer: Self-pay

## 2022-10-25 DIAGNOSIS — M899 Disorder of bone, unspecified: Secondary | ICD-10-CM

## 2022-10-26 ENCOUNTER — Inpatient Hospital Stay: Payer: 59 | Attending: Physician Assistant | Admitting: Physician Assistant

## 2022-10-26 ENCOUNTER — Inpatient Hospital Stay: Payer: 59

## 2022-10-26 ENCOUNTER — Encounter: Payer: Self-pay | Admitting: Physician Assistant

## 2022-10-26 ENCOUNTER — Other Ambulatory Visit: Payer: Self-pay

## 2022-10-26 VITALS — BP 129/69 | HR 65 | Temp 98.1°F | Resp 16 | Wt 221.9 lb

## 2022-10-26 DIAGNOSIS — D72828 Other elevated white blood cell count: Secondary | ICD-10-CM

## 2022-10-26 DIAGNOSIS — C7951 Secondary malignant neoplasm of bone: Secondary | ICD-10-CM

## 2022-10-26 DIAGNOSIS — R2230 Localized swelling, mass and lump, unspecified upper limb: Secondary | ICD-10-CM

## 2022-10-26 DIAGNOSIS — Z9884 Bariatric surgery status: Secondary | ICD-10-CM | POA: Insufficient documentation

## 2022-10-26 DIAGNOSIS — C772 Secondary and unspecified malignant neoplasm of intra-abdominal lymph nodes: Secondary | ICD-10-CM

## 2022-10-26 DIAGNOSIS — R2232 Localized swelling, mass and lump, left upper limb: Secondary | ICD-10-CM | POA: Diagnosis present

## 2022-10-26 DIAGNOSIS — C649 Malignant neoplasm of unspecified kidney, except renal pelvis: Secondary | ICD-10-CM | POA: Diagnosis not present

## 2022-10-26 DIAGNOSIS — D649 Anemia, unspecified: Secondary | ICD-10-CM

## 2022-10-26 DIAGNOSIS — Z79899 Other long term (current) drug therapy: Secondary | ICD-10-CM | POA: Insufficient documentation

## 2022-10-26 LAB — CMP (CANCER CENTER ONLY)
ALT: 16 U/L (ref 0–44)
AST: 17 U/L (ref 15–41)
Albumin: 4 g/dL (ref 3.5–5.0)
Alkaline Phosphatase: 157 U/L — ABNORMAL HIGH (ref 38–126)
Anion gap: 10 (ref 5–15)
BUN: 22 mg/dL — ABNORMAL HIGH (ref 6–20)
CO2: 26 mmol/L (ref 22–32)
Calcium: 10.2 mg/dL (ref 8.9–10.3)
Chloride: 105 mmol/L (ref 98–111)
Creatinine: 1.09 mg/dL (ref 0.61–1.24)
GFR, Estimated: >60 mL/min (ref >60)
Glucose, Bld: 91 mg/dL (ref 70–99)
Potassium: 4.2 mmol/L (ref 3.5–5.1)
Sodium: 141 mmol/L (ref 135–145)
Total Bilirubin: 0.4 mg/dL (ref 0.3–1.2)
Total Protein: 7.8 g/dL (ref 6.5–8.1)

## 2022-10-26 LAB — CBC WITH DIFFERENTIAL (CANCER CENTER ONLY)
Abs Immature Granulocytes: 0.28 10*3/uL — ABNORMAL HIGH (ref 0.00–0.07)
Basophils Absolute: 0.2 10*3/uL — ABNORMAL HIGH (ref 0.0–0.1)
Basophils Relative: 1 %
Eosinophils Absolute: 0.3 10*3/uL (ref 0.0–0.5)
Eosinophils Relative: 1 %
HCT: 32.7 % — ABNORMAL LOW (ref 39.0–52.0)
Hemoglobin: 10.5 g/dL — ABNORMAL LOW (ref 13.0–17.0)
Immature Granulocytes: 1 %
Lymphocytes Relative: 6 %
Lymphs Abs: 1.7 10*3/uL (ref 0.7–4.0)
MCH: 27.6 pg (ref 26.0–34.0)
MCHC: 32.1 g/dL (ref 30.0–36.0)
MCV: 85.8 fL (ref 80.0–100.0)
Monocytes Absolute: 1.5 10*3/uL — ABNORMAL HIGH (ref 0.1–1.0)
Monocytes Relative: 5 %
Neutro Abs: 26.3 10*3/uL — ABNORMAL HIGH (ref 1.7–7.7)
Neutrophils Relative %: 86 %
Platelet Count: 361 10*3/uL (ref 150–400)
RBC: 3.81 MIL/uL — ABNORMAL LOW (ref 4.22–5.81)
RDW: 16.5 % — ABNORMAL HIGH (ref 11.5–15.5)
WBC Count: 30.3 10*3/uL — ABNORMAL HIGH (ref 4.0–10.5)
nRBC: 0 % (ref 0.0–0.2)

## 2022-10-26 LAB — IRON AND IRON BINDING CAPACITY (CC-WL,HP ONLY)
Iron: 26 ug/dL — ABNORMAL LOW (ref 45–182)
Saturation Ratios: 9 % — ABNORMAL LOW (ref 17.9–39.5)
TIBC: 302 ug/dL (ref 250–450)
UIBC: 276 ug/dL (ref 117–376)

## 2022-10-26 LAB — C-REACTIVE PROTEIN: CRP: 4.1 mg/dL — ABNORMAL HIGH (ref <1.0)

## 2022-10-26 LAB — VITAMIN B12: Vitamin B-12: 3722 pg/mL — ABNORMAL HIGH (ref 180–914)

## 2022-10-26 LAB — SEDIMENTATION RATE: Sed Rate: 91 mm/hr — ABNORMAL HIGH (ref 0–16)

## 2022-10-26 LAB — FERRITIN: Ferritin: 274 ng/mL (ref 24–336)
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
## 2022-10-27 NOTE — Progress Notes (Signed)
Oley Balm, MD  Leodis Rains D PROCEDURE / BIOPSY REVIEW Date: 10/27/22  Requested Biopsy site: L acromion lesion Reason for request: r/o met h/o thryoid cancer Imaging review: Best seen on MRI 7/134/24 use PID/MRN  [id number] SOS  Decision: Approved Imaging modality to perform: CT Schedule with: Moderate Sedation Schedule for: Any VIR  Additional comments:   Please contact me with questions, concerns, or if issue pertaining to this request arise.  Dayne Oley Balm, MD [facility]

## 2022-10-30 ENCOUNTER — Telehealth: Payer: Self-pay

## 2022-10-30 ENCOUNTER — Telehealth: Payer: Self-pay | Admitting: Physician Assistant

## 2022-10-30 DIAGNOSIS — D508 Other iron deficiency anemias: Secondary | ICD-10-CM

## 2022-10-30 DIAGNOSIS — D509 Iron deficiency anemia, unspecified: Secondary | ICD-10-CM | POA: Insufficient documentation

## 2022-10-30 NOTE — Telephone Encounter (Signed)
I called Mr. George Crane to review the lab results from 10/26/2022. WBC was elevated to 30.3 and sed rate was 91, which suggests inflammatory process. Patient denies any infectious symptoms including fever and chills. 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 We will arrange for IV venofer 200 mg x 5 doses to bolster iron levels. Patient will proceed with CT scan and biopsy as scheduled.

## 2022-10-30 NOTE — Telephone Encounter (Signed)
Auth Submission: NO AUTH NEEDED Site of care: Site of care: [facility] Payer: [employer] Medication & CPT/J Code(s) submitted: Venofer (Iron Sucrose) J1756  Auth type: Buy/Bill HB Units/visits requested: 200mg  once a week x 5 doses  Approval from: 10/30/2022 to 03/02/2023

## 2022-11-01 ENCOUNTER — Ambulatory Visit (HOSPITAL_BASED_OUTPATIENT_CLINIC_OR_DEPARTMENT_OTHER)
Admission: RE | Admit: 2022-11-01 | Discharge: 2022-11-01 | Disposition: A | Discharge status: Home / Self Care | Payer: 59 | Source: Ambulatory Visit | Attending: Physician Assistant | Admitting: Physician Assistant

## 2022-11-01 ENCOUNTER — Encounter (HOSPITAL_BASED_OUTPATIENT_CLINIC_OR_DEPARTMENT_OTHER): Payer: Self-pay

## 2022-11-01 ENCOUNTER — Other Ambulatory Visit: Payer: Self-pay | Admitting: Student

## 2022-11-01 DIAGNOSIS — Z01812 Encounter for preprocedural laboratory examination: Secondary | ICD-10-CM

## 2022-11-01 DIAGNOSIS — R2230 Localized swelling, mass and lump, unspecified upper limb: Secondary | ICD-10-CM | POA: Insufficient documentation

## 2022-11-01 MED ORDER — IOHEXOL 300 MG/ML  SOLN
100.0000 mL | Freq: Once | INTRAMUSCULAR | Status: AC | PRN
  Administered 2022-11-01: 100 mL via INTRAVENOUS
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
## 2022-11-02 ENCOUNTER — Other Ambulatory Visit: Payer: Self-pay | Admitting: Physician Assistant

## 2022-11-02 ENCOUNTER — Ambulatory Visit: Admission: RE | Admit: 2022-11-02 | Payer: 59 | Source: Ambulatory Visit

## 2022-11-02 ENCOUNTER — Ambulatory Visit
Admission: RE | Admit: 2022-11-02 | Discharge: 2022-11-02 | Disposition: A | Discharge status: Home / Self Care | Payer: 59 | Source: Ambulatory Visit | Attending: Physician Assistant | Admitting: Physician Assistant

## 2022-11-02 ENCOUNTER — Other Ambulatory Visit: Payer: Self-pay

## 2022-11-02 DIAGNOSIS — E669 Obesity, unspecified: Secondary | ICD-10-CM | POA: Diagnosis not present

## 2022-11-02 DIAGNOSIS — Z85850 Personal history of malignant neoplasm of thyroid: Secondary | ICD-10-CM | POA: Diagnosis not present

## 2022-11-02 DIAGNOSIS — E785 Hyperlipidemia, unspecified: Secondary | ICD-10-CM | POA: Insufficient documentation

## 2022-11-02 DIAGNOSIS — R590 Localized enlarged lymph nodes: Secondary | ICD-10-CM | POA: Insufficient documentation

## 2022-11-02 DIAGNOSIS — R2232 Localized swelling, mass and lump, left upper limb: Secondary | ICD-10-CM | POA: Diagnosis present

## 2022-11-02 DIAGNOSIS — I10 Essential (primary) hypertension: Secondary | ICD-10-CM | POA: Insufficient documentation

## 2022-11-02 DIAGNOSIS — D72828 Other elevated white blood cell count: Secondary | ICD-10-CM

## 2022-11-02 DIAGNOSIS — N2889 Other specified disorders of kidney and ureter: Secondary | ICD-10-CM | POA: Diagnosis not present

## 2022-11-02 DIAGNOSIS — R2230 Localized swelling, mass and lump, unspecified upper limb: Secondary | ICD-10-CM

## 2022-11-02 DIAGNOSIS — D649 Anemia, unspecified: Secondary | ICD-10-CM

## 2022-11-02 DIAGNOSIS — C772 Secondary and unspecified malignant neoplasm of intra-abdominal lymph nodes: Secondary | ICD-10-CM | POA: Diagnosis not present

## 2022-11-02 DIAGNOSIS — Z01812 Encounter for preprocedural laboratory examination: Secondary | ICD-10-CM

## 2022-11-02 LAB — CBC
HCT: 31.8 % — ABNORMAL LOW (ref 39.0–52.0)
Hemoglobin: 10.1 g/dL — ABNORMAL LOW (ref 13.0–17.0)
MCH: 27.4 pg (ref 26.0–34.0)
MCHC: 31.8 g/dL (ref 30.0–36.0)
MCV: 86.4 fL (ref 80.0–100.0)
Platelets: 334 10*3/uL (ref 150–400)
RBC: 3.68 MIL/uL — ABNORMAL LOW (ref 4.22–5.81)
RDW: 16.4 % — ABNORMAL HIGH (ref 11.5–15.5)
WBC: 27.4 10*3/uL — ABNORMAL HIGH (ref 4.0–10.5)
nRBC: 0 % (ref 0.0–0.2)

## 2022-11-02 LAB — PROTIME-INR
INR: 1.3 — ABNORMAL HIGH (ref 0.8–1.2)
Prothrombin Time: 16.5 seconds — ABNORMAL HIGH (ref 11.4–15.2)

## 2022-11-02 MED ORDER — MIDAZOLAM HCL 2 MG/2ML IJ SOLN
INTRAMUSCULAR | Status: AC
  Filled 2022-11-02: qty 2

## 2022-11-02 MED ORDER — SODIUM CHLORIDE 0.9 % IV SOLN: INTRAVENOUS | Status: DC

## 2022-11-02 MED ORDER — LIDOCAINE HCL (PF) 1 % IJ SOLN
10.0000 mL | Freq: Once | INTRAMUSCULAR | Status: DC
  Filled 2022-11-02: qty 10

## 2022-11-02 MED ORDER — MIDAZOLAM HCL 2 MG/2ML IJ SOLN
INTRAMUSCULAR | Status: AC | PRN
  Administered 2022-11-02: 1 mg via INTRAVENOUS

## 2022-11-02 MED ORDER — FENTANYL CITRATE (PF) 100 MCG/2ML IJ SOLN
INTRAMUSCULAR | Status: AC
  Filled 2022-11-02: qty 2

## 2022-11-02 MED ORDER — FENTANYL CITRATE (PF) 100 MCG/2ML IJ SOLN
INTRAMUSCULAR | Status: AC | PRN
Start: 2022-11-02 — End: 2022-11-02
  Administered 2022-11-02: 50 ug via INTRAVENOUS
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
## 2022-11-02 NOTE — Procedures (Signed)
Interventional Radiology Procedure Note  Date of Procedure: 11/02/2022  Procedure: CT left retroperitoneal LN biopsy   Findings:  1. Left RP LN biposy 18ga x6 cores    Complications: No immediate complications noted.   Estimated Blood Loss: minimal  Follow-up and Recommendations: 1. Bedrest 2 hours    Olive Bass, MD  Vascular & Interventional Radiology  11/02/2022 10:00 AM

## 2022-11-07 ENCOUNTER — Ambulatory Visit (INDEPENDENT_AMBULATORY_CARE_PROVIDER_SITE_OTHER): Payer: 59

## 2022-11-07 VITALS — BP 111/68 | HR 58 | Temp 97.5°F | Resp 18 | Ht 72.0 in | Wt 222.2 lb

## 2022-11-07 DIAGNOSIS — D509 Iron deficiency anemia, unspecified: Secondary | ICD-10-CM | POA: Diagnosis not present

## 2022-11-07 DIAGNOSIS — D508 Other iron deficiency anemias: Secondary | ICD-10-CM

## 2022-11-07 MED ORDER — SODIUM CHLORIDE 0.9 % IV SOLN
200.0000 mg | Freq: Once | INTRAVENOUS | Status: AC
  Administered 2022-11-07: 200 mg via INTRAVENOUS
  Filled 2022-11-07: qty 10

## 2022-11-07 MED ORDER — DIPHENHYDRAMINE HCL 25 MG PO CAPS
25.0000 mg | ORAL_CAPSULE | Freq: Four times a day (QID) | ORAL | Status: DC | PRN
  Administered 2022-11-07: 25 mg via ORAL
  Filled 2022-11-07: qty 1

## 2022-11-07 MED ORDER — ACETAMINOPHEN 325 MG PO TABS
650.0000 mg | ORAL_TABLET | Freq: Once | ORAL | Status: AC
  Administered 2022-11-07: 650 mg via ORAL
  Filled 2022-11-07: qty 2

## 2022-11-07 NOTE — Progress Notes (Signed)
Diagnosis: Iron Deficiency Anemia  Provider:  Chilton Greathouse MD  Procedure: IV Infusion  IV Type: Peripheral, IV Location: L Forearm  Venofer (Iron Sucrose), Dose: 200 mg  Infusion Start Time: 1005  Infusion Stop Time: 1023  Post Infusion IV Care: Observation period completed and Peripheral IV Discontinued  Discharge: Condition: Good, Destination: Home . AVS Provided  Performed by:  Rico Ala, LPN

## 2022-11-07 NOTE — Patient Instructions (Signed)
Iron Sucrose Injection What is this medication? IRON SUCROSE (EYE ern SOO krose) treats low levels of iron (iron deficiency anemia) in people with kidney disease. Iron is a mineral that plays an important role in making red blood cells, which carry oxygen from your lungs to the rest of your body. This medicine may be used for other purposes; ask your health care provider or pharmacist if you have questions. COMMON BRAND NAME(S): Venofer What should I tell my care team before I take this medication? They need to know if you have any of these conditions: Anemia not caused by low iron levels Heart disease High levels of iron in the blood Kidney disease Liver disease An unusual or allergic reaction to iron, other medications, foods, dyes, or preservatives Pregnant or trying to get pregnant Breastfeeding How should I use this medication? This medication is for infusion into a vein. It is given in a hospital or clinic setting. Talk to your care team about the use of this medication in children. While this medication may be prescribed for children as young as 2 years for selected conditions, precautions do apply. Overdosage: If you think you have taken too much of this medicine contact a poison control center or emergency room at once. NOTE: This medicine is only for you. Do not share this medicine with others. What if I miss a dose? Keep appointments for follow-up doses. It is important not to miss your dose. Call your care team if you are unable to keep an appointment. What may interact with this medication? Do not take this medication with any of the following: Deferoxamine Dimercaprol Other iron products This medication may also interact with the following: Chloramphenicol Deferasirox This list may not describe all possible interactions. Give your health care provider a list of all the medicines, herbs, non-prescription drugs, or dietary supplements you use. Also tell them if you smoke,  drink alcohol, or use illegal drugs. Some items may interact with your medicine. What should I watch for while using this medication? Visit your care team regularly. Tell your care team if your symptoms do not start to get better or if they get worse. You may need blood work done while you are taking this medication. You may need to follow a special diet. Talk to your care team. Foods that contain iron include: whole grains/cereals, dried fruits, beans, or peas, leafy green vegetables, and organ meats (liver, kidney). What side effects may I notice from receiving this medication? Side effects that you should report to your care team as soon as possible: Allergic reactions--skin rash, itching, hives, swelling of the face, lips, tongue, or throat Low blood pressure--dizziness, feeling faint or lightheaded, blurry vision Shortness of breath Side effects that usually do not require medical attention (report to your care team if they continue or are bothersome): Flushing Headache Joint pain Muscle pain Nausea Pain, redness, or irritation at injection site This list may not describe all possible side effects. Call your doctor for medical advice about side effects. You may report side effects to FDA at 1-800-FDA-1088. Where should I keep my medication? This medication is given in a hospital or clinic. It will not be stored at home. NOTE: This sheet is a summary. It may not cover all possible information. If you have questions about this medicine, talk to your doctor, pharmacist, or health care provider.  2024 Elsevier/Gold Standard (2022-08-25 00:00:00)

## 2022-11-09 ENCOUNTER — Inpatient Hospital Stay: Payer: 59 | Attending: Physician Assistant | Admitting: Hematology and Oncology

## 2022-11-09 ENCOUNTER — Other Ambulatory Visit: Payer: Self-pay

## 2022-11-09 VITALS — BP 143/76 | HR 62 | Temp 97.9°F | Resp 13 | Wt 222.5 lb

## 2022-11-09 DIAGNOSIS — Z79899 Other long term (current) drug therapy: Secondary | ICD-10-CM | POA: Insufficient documentation

## 2022-11-09 DIAGNOSIS — C772 Secondary and unspecified malignant neoplasm of intra-abdominal lymph nodes: Secondary | ICD-10-CM | POA: Diagnosis not present

## 2022-11-09 DIAGNOSIS — C649 Malignant neoplasm of unspecified kidney, except renal pelvis: Secondary | ICD-10-CM | POA: Diagnosis not present

## 2022-11-09 DIAGNOSIS — C7951 Secondary malignant neoplasm of bone: Secondary | ICD-10-CM | POA: Diagnosis not present

## 2022-11-09 DIAGNOSIS — D508 Other iron deficiency anemias: Secondary | ICD-10-CM | POA: Diagnosis not present

## 2022-11-09 DIAGNOSIS — R2230 Localized swelling, mass and lump, unspecified upper limb: Secondary | ICD-10-CM

## 2022-11-09 DIAGNOSIS — C641 Malignant neoplasm of right kidney, except renal pelvis: Secondary | ICD-10-CM | POA: Diagnosis not present

## 2022-11-09 MED ORDER — HYDROCODONE-ACETAMINOPHEN 10-325 MG PO TABS: 1.0000 | ORAL_TABLET | Freq: Four times a day (QID) | ORAL | 0 refills | Status: DC | PRN

## 2022-11-10 ENCOUNTER — Encounter: Payer: Self-pay | Admitting: Physician Assistant

## 2022-11-10 ENCOUNTER — Other Ambulatory Visit (HOSPITAL_COMMUNITY): Payer: Self-pay

## 2022-11-10 DIAGNOSIS — C649 Malignant neoplasm of unspecified kidney, except renal pelvis: Secondary | ICD-10-CM | POA: Insufficient documentation

## 2022-11-10 MED ORDER — CABOZANTINIB S-MALATE 40 MG PO TABS
40.0000 mg | ORAL_TABLET | Freq: Every day | ORAL | 2 refills | Status: DC
  Filled 2022-11-10: qty 30, 30d supply, fill #0
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
## 2022-11-13 ENCOUNTER — Other Ambulatory Visit (HOSPITAL_COMMUNITY): Payer: Self-pay

## 2022-11-13 ENCOUNTER — Telehealth: Payer: Self-pay | Admitting: Pharmacy Technician

## 2022-11-13 ENCOUNTER — Encounter: Payer: Self-pay | Admitting: Physician Assistant

## 2022-11-13 ENCOUNTER — Other Ambulatory Visit: Payer: Self-pay

## 2022-11-13 ENCOUNTER — Other Ambulatory Visit: Payer: Self-pay | Admitting: *Deleted

## 2022-11-13 ENCOUNTER — Telehealth: Payer: Self-pay | Admitting: Pharmacist

## 2022-11-13 DIAGNOSIS — C641 Malignant neoplasm of right kidney, except renal pelvis: Secondary | ICD-10-CM

## 2022-11-13 MED ORDER — CABOZANTINIB S-MALATE 40 MG PO TABS
40.0000 mg | ORAL_TABLET | Freq: Every day | ORAL | 2 refills | Status: DC
Start: 2022-11-13 — End: 2023-01-18

## 2022-11-13 NOTE — Telephone Encounter (Signed)
Oral Oncology Pharmacist Encounter   Patient's insurance requires them to fill through [employer]. Cabometyx prescription has been redirected for dispensing.    Oral Chemotherapy Clinic will follow up with outside pharmacy to ensure Rx is delivered.    Lenord Carbo, PharmD, BCPS, [facility] Hematology/Oncology Clinical Pharmacist [facility] and [facility] Oral Chemotherapy Navigation Clinics [phone] 11/13/2022 9:43 AM

## 2022-11-13 NOTE — Telephone Encounter (Signed)
Oral Oncology Pharmacist Encounter  Received new prescription for Cabometyx (cabozantinib) for the treatment of metastatic clear cell RCC in conjunction with nivolumab, planned duration until disease progression or unacceptable drug toxicity.  CBC w/ Diff and CMP from 10/26/22 assessed, no baseline dose adjustments required at this time. Prescription dose and frequency assessed for appropriateness.  Current medication list in Epic reviewed, no relevant/significant DDIs with Cabometyx identified.  Evaluated chart and no patient barriers to medication adherence noted.   Prescription has been e-scribed to the [facility] for benefits analysis and approval.  Oral Oncology Clinic will continue to follow for insurance authorization, copayment issues, initial counseling and start date.  Lenord Carbo, PharmD, BCPS, [facility] Hematology/Oncology Clinical Pharmacist [facility] and [facility] Oral Chemotherapy Navigation Clinics [phone] 11/13/2022 8:44 AM

## 2022-11-13 NOTE — Telephone Encounter (Signed)
Oral Oncology Patient Advocate Encounter  Prior Authorization for Cabometyx has been approved.    PA# [id number] Effective dates: 11/13/22 through 11/13/23  Patient must fill at [facility]   Jinger Neighbors, CPhT-Adv Oncology Pharmacy Patient Advocate [facility] Direct Number: [phone]  Fax: [phone]

## 2022-11-13 NOTE — Telephone Encounter (Signed)
Oral Oncology Patient Advocate Encounter   Received notification that prior authorization for Cabometyx is required.   PA submitted on 11/13/22 Key [id number] Status is pending     Jinger Neighbors, CPhT-Adv Oncology Pharmacy Patient Advocate [facility] Direct Number: [phone]  Fax: [phone]

## 2022-11-13 NOTE — Telephone Encounter (Signed)
Oral Oncology Patient Advocate Encounter   Was successful in obtaining a copay card for Cabometyx.  This copay card will make the patients copay $0.  I have spoken with the patient.    The billing information is as follows and has been shared with [facility].   RxBin: [id number] PCN: Loyalty Member ID: [id number] Group ID: [id number]   Jinger Neighbors, CPhT-Adv Oncology Pharmacy Patient Advocate [facility] Direct Number: [phone]  Fax: [phone]

## 2022-11-14 ENCOUNTER — Ambulatory Visit (INDEPENDENT_AMBULATORY_CARE_PROVIDER_SITE_OTHER): Payer: 59

## 2022-11-14 VITALS — BP 114/68 | HR 59 | Temp 98.1°F | Resp 17 | Ht 72.0 in | Wt 224.4 lb

## 2022-11-14 DIAGNOSIS — D509 Iron deficiency anemia, unspecified: Secondary | ICD-10-CM | POA: Diagnosis not present

## 2022-11-14 DIAGNOSIS — D508 Other iron deficiency anemias: Secondary | ICD-10-CM

## 2022-11-14 MED ORDER — SODIUM CHLORIDE 0.9 % IV SOLN
200.0000 mg | Freq: Once | INTRAVENOUS | Status: AC
  Administered 2022-11-14: 200 mg via INTRAVENOUS
  Filled 2022-11-14: qty 10

## 2022-11-14 MED ORDER — DIPHENHYDRAMINE HCL 25 MG PO CAPS
25.0000 mg | ORAL_CAPSULE | Freq: Once | ORAL | Status: AC
  Administered 2022-11-14: 25 mg via ORAL
  Filled 2022-11-14: qty 1

## 2022-11-14 MED ORDER — ACETAMINOPHEN 325 MG PO TABS
650.0000 mg | ORAL_TABLET | Freq: Once | ORAL | Status: AC
  Administered 2022-11-14: 650 mg via ORAL
  Filled 2022-11-14: qty 2

## 2022-11-14 NOTE — Progress Notes (Signed)
Diagnosis: Iron Deficiency Anemia  Provider:  Chilton Greathouse MD  Procedure: IV Infusion  IV Type: Peripheral, IV Location: R Forearm  Venofer (Iron Sucrose), Dose: 200 mg  Infusion Start Time: 0943  Infusion Stop Time: 1001  Post Infusion IV Care: Patient declined observation. PIV removed  Discharge: Condition: Good, Destination: Home . AVS Declined  Performed by:  Rico Ala, LPN
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
## 2022-11-15 NOTE — Telephone Encounter (Signed)
Oral Chemotherapy Pharmacist Encounter   Spoke with patient today to follow up regarding patient's oral chemotherapy medication: Cabometyx (cabozantinib).  Notified patient medication is ready to be set up for shipment through [employer]. Patient provided with phone number to call and set up shipment (tele: [phone]).  I will follow up with patient for initial counseling on medication once it is scheduled for delivery to patient's home.   Lenord Carbo, PharmD, BCPS, [facility] Hematology/Oncology Clinical Pharmacist [facility] and [facility] Oral Chemotherapy Navigation Clinics [phone] 11/15/2022 10:04 AM

## 2022-11-16 ENCOUNTER — Encounter: Payer: Self-pay | Admitting: Hematology

## 2022-11-16 ENCOUNTER — Inpatient Hospital Stay: Payer: 59 | Admitting: Hematology

## 2022-11-16 VITALS — BP 136/72 | HR 67 | Temp 98.2°F | Resp 17 | Ht 72.0 in | Wt 223.9 lb

## 2022-11-16 DIAGNOSIS — C641 Malignant neoplasm of right kidney, except renal pelvis: Secondary | ICD-10-CM

## 2022-11-16 DIAGNOSIS — C772 Secondary and unspecified malignant neoplasm of intra-abdominal lymph nodes: Secondary | ICD-10-CM

## 2022-11-16 DIAGNOSIS — Z79899 Other long term (current) drug therapy: Secondary | ICD-10-CM

## 2022-11-16 DIAGNOSIS — C7951 Secondary malignant neoplasm of bone: Secondary | ICD-10-CM

## 2022-11-16 DIAGNOSIS — C649 Malignant neoplasm of unspecified kidney, except renal pelvis: Secondary | ICD-10-CM | POA: Diagnosis not present

## 2022-11-16 MED ORDER — PROCHLORPERAZINE MALEATE 10 MG PO TABS: 10.0000 mg | ORAL_TABLET | Freq: Four times a day (QID) | ORAL | 0 refills | Status: DC | PRN

## 2022-11-16 NOTE — Patient Instructions (Addendum)
[facility]  Discharge Instructions  You were seen in consult today by Dr. Ellin Saba.  He discussed your diagnosis of non-clear cell carcinoma of the kidney.  He discussed the treatment options with you.  He discussed that since this is a stage IV cancer meaning that it has spread from the kidney to the lungs and other areas in your body, he does not recommend the radiotherapy to the kidney that you were interested in.  It would not create great benefits to the overall disease burden.  He wants to send your biopsy for some additional testing to see if there are any additional targets in your specific cancer that we can treat.  He wants to do a bone scan which we will schedule before you leave today.  We will also schedule you for genetic counseling and testing.    He recommends using the cabozantinib with nivolumab.  We will arrange for treatment to start next week.  In the mean time, we will send a second opinion referral to [facility] per your request.  They will have access to our records.  Our plan will be to begin treatment and then in 3 months we will order a scan to determine how you have responded to treatment.   He wants you to start taking the cabozantinib tomorrow.  Be sure to take it on an empty stomach.     Thank you for choosing [facility] to provide your oncology and hematology care.   To afford each patient quality time with our provider, please arrive at least 15 minutes before your scheduled appointment time. You may need to reschedule your appointment if you arrive late (10 or more minutes). Arriving late affects you and other patients whose appointments are after yours.  Also, if you miss three or more appointments without notifying the office, you may be dismissed from the clinic at the provider's discretion.    Again, thank you for choosing [facility].  Our hope is that these requests will decrease the amount of time  that you wait before being seen by our physicians.   If you have a lab appointment with the Cancer Center - please note that after April 8th, all labs will be drawn in the cancer center.  You do not have to check in or register with the main entrance as you have in the past but will complete your check-in at the cancer center.            _____________________________________________________________  Should you have questions after your visit to [facility], please contact our office at [phone] and follow the prompts.  Our office hours are 8:00 a.m. to 4:30 p.m. Monday - Thursday and 8:00 a.m. to 2:30 p.m. Friday.  Please note that voicemails left after 4:00 p.m. may not be returned until the following business day.  We are closed weekends and all major holidays.  You do have access to a nurse 24-7, just call the main number to the clinic [phone] and do not press any options, hold on the line and a nurse will answer the phone.    For prescription refill requests, have your pharmacy contact our office and allow 72 hours.    Masks are no longer required in the cancer centers. If you would like for your care team to wear a mask while they are taking care of you, please let them know. You may have  one support person who is at least 57 years old accompany you for your appointments.

## 2022-11-16 NOTE — Progress Notes (Signed)
START OFF PATHWAY REGIMEN - Renal Cell   [id number]:Cabozantinib (Cabometyx) 40 mg PO Daily D1-28 + Nivolumab 480 mg IV D1 q28 Days:   A cycle is every 28 days:     Cabozantinib (tablet)      Nivolumab   **Always confirm dose/schedule in your pharmacy ordering system**  Patient Characteristics: Stage IV (Unresected T4M0 or Any T, M1)/Metastatic Disease, Non-Clear Cell, First Line Therapeutic Status: Stage IV (Unresected T4M0 or Any T, M1)/Metastatic Disease Histology: Non-Clear Cell Line of therapy: First Line Intent of Therapy: Non-Curative / Palliative Intent, Discussed with Patient

## 2022-11-16 NOTE — Telephone Encounter (Signed)
Oral Chemotherapy Pharmacist Encounter  I spoke with patient for overview of: Cabometyx for the treatment of metastatic renal cell carcinoma, planned duration until disease progression or unacceptable toxicity.   Counseled patient on administration, dosing, side effects, monitoring, drug-food interactions, safe handling, storage, and disposal.  Patient will take Cabometyx 40mg  tablets, 1 tablet (40mg ) by mouth once daily on an empty stomach, 1 hour before or 2 hours after a meal.  Patient knows to avoid grapefruit and grapefruit juice.  Cabometyx start date: 11/17/22  Adverse effects include but are not limited to: diarrhea, nausea, fatigue, hypertension, hand-foot syndrome, decreased blood counts, and electrolyte abnormalities. Hand-foot syndrome: discussed use of cream such as Udderly Smooth Extra Care 20 or equivalent advanced care cream that has 20% urea content for advanced skin hydration while on Cabometyx Diarrhea: Patient will obtain Imodium (loperamide) to have on hand if they experience diarrhea. Patient knows to alert the office of 4 or more loose stools above baseline. Nausea: patient will alert office if he experiences issues with nausea and would like an antiemetic to have on hang at home.   Patient informed that Cabometyx should be held at least 3 weeks prior to any scheduled surgery (including dental surgery) and not resumed until at least 2 weeks after major surgery and until adequate wound healing is established.  Reviewed with patient importance of keeping a medication schedule and plan for any missed doses. No barriers to medication adherence identified.  Medication reconciliation performed and medication/allergy list updated.  All questions answered.  George Crane voiced understanding and appreciation.   Medication education handout placed in mail for patient. Patient knows to call the office with questions or concerns. Oral Chemotherapy Clinic phone number provided to  patient.   Lenord Carbo, PharmD, BCPS, BCOP Hematology/Oncology Clinical Pharmacist [facility] and [facility] Oral Chemotherapy Navigation Clinics [phone] 11/16/2022 9:08 AM

## 2022-11-17 ENCOUNTER — Other Ambulatory Visit (HOSPITAL_COMMUNITY): Payer: Self-pay | Admitting: Oncology

## 2022-11-17 ENCOUNTER — Encounter: Payer: Self-pay | Admitting: *Deleted

## 2022-11-17 MED ORDER — HYDROCODONE-ACETAMINOPHEN 10-325 MG PO TABS: 1.0000 | ORAL_TABLET | Freq: Four times a day (QID) | ORAL | 0 refills | Status: DC | PRN

## 2022-11-17 NOTE — Progress Notes (Signed)
Per Dr. Marice Potter request, I called patient to advise of a provider in [place] that could see the patient and assist with a medical marijuana card.  I gave patient the following information:  Provider: Jolee Ewing  [phone] [place], [place] [address] [phone]  Dr. Ellin Saba also looked up the providers that were provided by the patient for the radiofrequency ablation procedure.  He was not able to confirm the providers actually perform that procedure and asked for her to bring the information in about it.  He will review and send referral to appropriate facility from there.    Patient verbalized understanding of the above. No further questions at this time.

## 2022-11-18 ENCOUNTER — Other Ambulatory Visit: Payer: Self-pay

## 2022-11-21 ENCOUNTER — Ambulatory Visit (INDEPENDENT_AMBULATORY_CARE_PROVIDER_SITE_OTHER): Payer: 59 | Admitting: *Deleted

## 2022-11-21 VITALS — BP 120/71 | HR 55 | Temp 97.4°F | Resp 16 | Ht 72.0 in | Wt 218.8 lb

## 2022-11-21 DIAGNOSIS — D508 Other iron deficiency anemias: Secondary | ICD-10-CM

## 2022-11-21 DIAGNOSIS — D509 Iron deficiency anemia, unspecified: Secondary | ICD-10-CM

## 2022-11-21 MED ORDER — SODIUM CHLORIDE 0.9 % IV SOLN
200.0000 mg | Freq: Once | INTRAVENOUS | Status: AC
  Administered 2022-11-21: 200 mg via INTRAVENOUS
  Filled 2022-11-21: qty 10

## 2022-11-21 MED ORDER — ACETAMINOPHEN 325 MG PO TABS
650.0000 mg | ORAL_TABLET | Freq: Once | ORAL | Status: AC
  Administered 2022-11-21: 650 mg via ORAL
  Filled 2022-11-21: qty 2

## 2022-11-21 MED ORDER — DIPHENHYDRAMINE HCL 25 MG PO CAPS
25.0000 mg | ORAL_CAPSULE | Freq: Once | ORAL | Status: AC
  Administered 2022-11-21: 25 mg via ORAL
  Filled 2022-11-21: qty 1

## 2022-11-21 NOTE — Progress Notes (Signed)
Pharmacist Chemotherapy Monitoring - Initial Assessment    Anticipated start date: 11/23/22   The following has been reviewed per standard work regarding the patient's treatment regimen: The patient's diagnosis, treatment plan and drug doses, and organ/hematologic function Lab orders and baseline tests specific to treatment regimen  The treatment plan start date, drug sequencing, and pre-medications Prior authorization status  Patient's documented medication list, including drug-drug interaction screen and prescriptions for anti-emetics and supportive care specific to the treatment regimen The drug concentrations, fluid compatibility, administration routes, and timing of the medications to be used The patient's access for treatment and lifetime cumulative dose history, if applicable  The patient's medication allergies and previous infusion related reactions, if applicable   Changes made to treatment plan:  N/A  Follow up needed:  N/A   Stephens Shire, [facility], 11/21/2022  3:34 PM

## 2022-11-21 NOTE — Progress Notes (Addendum)
Diagnosis: Iron Deficiency Anemia  Provider:  Chilton Greathouse MD  Procedure: IV Infusion  IV Type: Peripheral, IV Location: R Forearm  Venofer (Iron Sucrose), Dose: 200 mg  Infusion Start Time: 1610 am  Infusion Stop Time: 0957 am  Post Infusion IV Care: Observation period completed and Peripheral IV Discontinued  Discharge: Condition: Good, Destination: Home . AVS Declined  Performed by:  Forrest Moron, RN

## 2022-11-22 ENCOUNTER — Encounter (HOSPITAL_COMMUNITY)
Admission: RE | Admit: 2022-11-22 | Discharge: 2022-11-22 | Disposition: A | Discharge status: Home / Self Care | Payer: 59 | Source: Ambulatory Visit | Attending: Hematology | Admitting: Hematology

## 2022-11-22 ENCOUNTER — Other Ambulatory Visit: Payer: Self-pay

## 2022-11-22 DIAGNOSIS — C641 Malignant neoplasm of right kidney, except renal pelvis: Secondary | ICD-10-CM | POA: Insufficient documentation

## 2022-11-22 MED ORDER — TECHNETIUM TC 99M MEDRONATE IV KIT
20.0000 | PACK | Freq: Once | INTRAVENOUS | Status: AC | PRN
  Administered 2022-11-22: 19 via INTRAVENOUS

## 2022-11-22 NOTE — Patient Instructions (Addendum)
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  You will be treated in the clinic every 3 weeks with immunotherapy known as Opdivo. You will receive treatment here in the Cancer Center once every 4 weeks. You will also take a medication at home known as Cabometyx that works in conjunction with the Opdivo. The intent of treatment is to help control your cancer, keep it from spreading further, and to alleviate any symptoms you may be having related to your disease. You will see the doctor regularly throughout treatment.  We will obtain blood work from you prior to every treatment and monitor your results to make sure it is safe to give your treatment. The doctor monitors your response to treatment by the way you are feeling, your blood work, and by obtaining scans periodically. There will be wait times while you are here for treatment. It will take about 30 minutes to 1 hour for your lab work to result.  Then there will be wait times while pharmacy mixes your medications.    Nivolumab (Opdivo)   About This Drug   Nivolumab is used to treat cancer. It is given in the vein (IV).   It helps your immune system work properly to target and kill cancer cells. It will take 30 minutes to infuse.     These are some of the most common or important side effects:    Immune Reactions    This medication stimulates your immune system. Your immune system can attack normal organs and tissues in your body, leading to serious or life threatening complications. It is important to notify your healthcare provider right away if you develop any of the following symptoms:    Diarrhea / Intestinal problems (colitis, inflammation of the bowel): Abdominal pain, diarrhea, cramping, mucus or blood in the stool, dark or tar-like stools, fever. Diarrhea means different things to different people. Any increase in your normal bowel patterns can be defined as diarrhea and  should be reported to your healthcare team.   Skin reactions: Report rash, with or without itching (pruritis), sores in your mouth, blistering or peeling skin, as these can become severe and require treatment with corticosteroids.   Lung problems (pneumonitis, inflammation of the lung): New or worsening cough, shortness of breath, trouble breathing, or chest pain.   Liver problems (hepatitis, inflammation of the liver): Yellowing of the skin or eyes, your urine appears dark or brown, pain in your abdomen, bleeding or bruising more easily than normal, or severe nausea and vomiting.    Brain and/or nerve problems: Report any headache, drooping of eyelids, double vision, trouble swallowing, weakness of arms, legs or face, or numbness or tingling in the hands or feet to your healthcare team.   Hormone abnormalities: Immune reactions can affect the pituitary, thyroid, pancreas and adrenal glands, resulting in inflammation of these glands, which can affect their production of certain hormones. Some hormone levels can be monitored with blood work. It is important that you report any changes in how you are feeling to your care team. Symptoms of these hormonal changes can include: headaches, nausea, vomiting, constipation, rapid heart rate, increased sweating, extreme fatigue, weakness, changes in your voice, changes in memory and concentration, increased hunger or thirst, increased urination, weight gain, hair loss, dizziness, feeling cold all the time, and changes in mood or behavior (including irritability, forgetfulness and decreased sex drive).     Eye problems: Report any changes in vision,  blurry or double vision, and eye pain or redness to your healthcare team.  Kidney problems (kidney inflammation or failure): Decreased urine output, blood in the urine, swelling in the ankles, loss of appetite.   Fatigue:  Fatigue is very common during cancer treatment and is an overwhelming feeling of exhaustion that  is not usually relieved by rest. While on cancer treatment, and for a period after, you may need to adjust your schedule to manage fatigue. Plan times to rest during the day and conserve energy for more important activities. Exercise can help combat fatigue; a simple daily walk with a friend can help.  Talk to your healthcare team for helpful tips on dealing with this side effect.   Allergic Reactions:  In some cases, patients can have an allergic reaction to this medication. Signs of a reaction can include: shortness of breath or difficulty breathing, chest pain, rash, flushing or itching or a decrease in blood pressure. If you notice any changes in how you feel during the infusion, let your nurse know immediately. The infusion will be slowed or stopped if this occurs.    Less common, but important side effects can include:   Allogenic Stem Cell Transplant Reactions: Patients who receive this medication before or after having an allogenic stem cell transplant can be at an increased risk of graft vs. host disease, veno-occlusive disease and fever syndrome. You providers will monitor you closely for these side effects.    Reproductive Concerns  Exposure of an unborn child to this medication could cause birth defects, so you should not become pregnant or father a child while on this medication. Even if your menstrual cycle stops or you believe you are not producing sperm, effective birth control is necessary during treatment and for 5 months after stopping treatment. You should not breastfeed while taking this medication or for five months after the end of treatment.    Important Information    This drug may be present in the saliva, tears, sweat, urine, stool, vomit, semen, and vaginal secretions. Talk to your doctor and/or your nurse about the necessary precautions to take during this time.   Treating Side Effects    To decrease infection, wash your hands regularly    Avoid close contact with  people who have a cold, the flu, or other infections.    Take your temperature as your doctor or nurse tells you, and whenever you feel like you may have a fever    To help decrease bleeding, use a soft toothbrush. Check with your nurse before using dental floss.    Be very careful when using knives or tools    Use an electric shaver instead of a razor    Ask your doctor or nurse about medicines that are available to help stop or lessen constipation, diarrhea and/or nausea.    Drink plenty of fluids (a minimum of eight glasses per day is recommended).    If you are not able to move your bowels, check with your doctor or nurse before you use any enemas, laxatives, or suppositories    To help with nausea and vomiting, eat small, frequent meals instead of three large meals a day. Choose foods and drinks that are at room temperature. Ask your nurse or doctor about other helpful tips and medicine that is available to help or stop lessen these symptoms.    If you get diarrhea, eat low-fiber foods that are high in protein and calories and avoid foods that can irritate  your digestive tracts or lead to cramping. Ask your nurse or doctor about medicine that can lessen or stop your diarrhea.    To help with decreased appetite, eat small, frequent meals    Eat high caloric food such as pudding, ice cream, yogurt and milkshakes.    Manage tiredness by pacing your activities for the day. Be sure to include periods of rest between energy-draining activities    Keeping your pain under control is important to your wellbeing. Please tell your doctor or nurse if you are experiencing pain.    If you have diabetes, keep good control of your blood sugar level. Tell your nurse or your doctor if your glucose levels are higher or lower than normal    If you get a rash do not put anything on it unless your doctor or nurse says you may. Keep the area around the rash clean and dry. Ask your doctor for medicine if  your rash bothers you.    Infusion reactions may occur after your infusion. If this happens, call 911 for emergency care.   Food and Drug Interactions    There are no known interactions of nivolumab with food or other medications.    Tell your doctor and pharmacist about all the medicines and dietary supplements (vitamins, minerals, herbs and others) that you are taking at this time. The safety and use of dietary supplements and alternative diets are often not known. Using these might affect your cancer or interfere with your treatment. Until more is known, you should not use dietary supplements or alternative diets without your cancer doctor's help.   When to Call the Doctor   Call your doctor or nurse if you have any of these symptoms and/or any new or unusual symptoms:    Fever of 100.4 F (38 C) or higher    Chills    Easy bleeding or bruising    Wheezing or trouble breathing    Dry cough, or cough with yellow, green or bloody mucus    Confusion and/or agitation    Hallucinations    Trouble understanding or speaking    Blurry vision or changes in your eyesight    Numbness or lack of strength to your arms, legs, face, or body    Feeling dizzy or lightheaded    Loose bowel movements (diarrhea) more than 4 times a day or diarrhea with weakness or lightheadedness    Nausea that stops you from eating or drinking, and/or that is not relieved by prescribed medicines    Lasting loss of appetite or rapid weight loss of five pounds in a week    No bowel movement for 3 days or you feel uncomfortable    Bad abdominal pain, especially in upper right area    Fatigue or extreme weakness that interferes with normal activities    Decreased urine    Unusual thirst or passing urine often    Rash that is not relieved by prescribed medicines    Rash or itching    Flu-like symptoms: fever, headache, muscle and joint aches, and fatigue (low energy, feeling weak)    Signs of  liver problems: dark urine, pale bowel movements, bad stomach pain, feeling very tired and weak, unusual itching, or yellowing of the eyes or skin.    Signs of infusion reaction: fever or shaking chills, flushing, facial swelling, feeling dizzy, headache, trouble breathing, rash, itching, chest tightness, or chest pain.    If you think you may be pregnant  Reproduction Warnings    Pregnancy warning: This drug can have harmful effects on the unborn baby. Women of child bearing potential should use effective methods of birth control during your cancer treatment and for at least 5 months after treatment. Let your doctor know right away if you think you may be pregnant.    Breastfeeding warning: It is not known if this drug passes into breast milk. For this reason, women should not breast feed during treatment because this drug could enter the breast milk and cause harm to a breast feeding baby.    Fertility warning: Human fertility studies have not been done with this drug. Talk with your doctor or nurse if you plan to have children. Ask for information on sperm or egg banking.      Wear comfortable clothing and clothing appropriate for easy access to any Portacath or PICC line. Let us know if there is anything that we can do to make your therapy better!     What to do if you need assistance after hours or on the weekends: CALL [phone].  HOLD on the line, do not hang up.  You will hear multiple messages but at the end you will be connected with a nurse triage line.  They will contact the doctor if necessary.  Most of the time they will be able to assist you.  Do not call the hospital operator.     I have been informed and understand all of the instructions given to me and have received a copy. I have been instructed to call the clinic [phone] or my family physician as soon as possible for continued medical care, if indicated. I do not have any more questions at this time but understand  that I may call the Cancer Center or the Patient Navigator at [phone] during office hours should I have questions or need assistance in obtaining follow-up care.
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
## 2022-11-23 ENCOUNTER — Inpatient Hospital Stay: Payer: 59

## 2022-11-23 ENCOUNTER — Inpatient Hospital Stay: Payer: 59 | Admitting: Hematology

## 2022-11-23 VITALS — BP 118/84 | HR 47 | Temp 97.9°F | Resp 18

## 2022-11-23 DIAGNOSIS — C641 Malignant neoplasm of right kidney, except renal pelvis: Secondary | ICD-10-CM

## 2022-11-23 DIAGNOSIS — C649 Malignant neoplasm of unspecified kidney, except renal pelvis: Secondary | ICD-10-CM | POA: Diagnosis not present

## 2022-11-23 LAB — CBC WITH DIFFERENTIAL/PLATELET
Abs Immature Granulocytes: 0.08 10*3/uL — ABNORMAL HIGH (ref 0.00–0.07)
Basophils Absolute: 0.1 10*3/uL (ref 0.0–0.1)
Basophils Relative: 1 %
Eosinophils Absolute: 0.3 10*3/uL (ref 0.0–0.5)
Eosinophils Relative: 3 %
HCT: 35.2 % — ABNORMAL LOW (ref 39.0–52.0)
Hemoglobin: 11.2 g/dL — ABNORMAL LOW (ref 13.0–17.0)
Immature Granulocytes: 1 %
Lymphocytes Relative: 12 %
Lymphs Abs: 1.2 10*3/uL (ref 0.7–4.0)
MCH: 27.8 pg (ref 26.0–34.0)
MCHC: 31.8 g/dL (ref 30.0–36.0)
MCV: 87.3 fL (ref 80.0–100.0)
Monocytes Absolute: 0.5 10*3/uL (ref 0.1–1.0)
Monocytes Relative: 5 %
Neutro Abs: 8.3 10*3/uL — ABNORMAL HIGH (ref 1.7–7.7)
Neutrophils Relative %: 78 %
Platelets: 343 10*3/uL (ref 150–400)
RBC: 4.03 MIL/uL — ABNORMAL LOW (ref 4.22–5.81)
RDW: 16.1 % — ABNORMAL HIGH (ref 11.5–15.5)
WBC: 10.5 10*3/uL (ref 4.0–10.5)
nRBC: 0 % (ref 0.0–0.2)

## 2022-11-23 LAB — COMPREHENSIVE METABOLIC PANEL
ALT: 26 U/L (ref 0–44)
AST: 31 U/L (ref 15–41)
Albumin: 3.5 g/dL (ref 3.5–5.0)
Alkaline Phosphatase: 128 U/L — ABNORMAL HIGH (ref 38–126)
Anion gap: 8 (ref 5–15)
BUN: 19 mg/dL (ref 6–20)
CO2: 25 mmol/L (ref 22–32)
Calcium: 9.2 mg/dL (ref 8.9–10.3)
Chloride: 104 mmol/L (ref 98–111)
Creatinine, Ser: 1.06 mg/dL (ref 0.61–1.24)
GFR, Estimated: >60 mL/min (ref >60)
Glucose, Bld: 79 mg/dL (ref 70–99)
Potassium: 4.6 mmol/L (ref 3.5–5.1)
Sodium: 137 mmol/L (ref 135–145)
Total Bilirubin: 0.5 mg/dL (ref 0.3–1.2)
Total Protein: 8 g/dL (ref 6.5–8.1)

## 2022-11-23 LAB — MAGNESIUM: Magnesium: 2.2 mg/dL (ref 1.7–2.4)

## 2022-11-23 LAB — TSH: TSH: 2.505 u[IU]/mL (ref 0.350–4.500)

## 2022-11-23 MED ORDER — SODIUM CHLORIDE 0.9% FLUSH: 10.0000 mL | INTRAVENOUS | Status: DC | PRN

## 2022-11-23 MED ORDER — SODIUM CHLORIDE 0.9 % IV SOLN
480.0000 mg | Freq: Once | INTRAVENOUS | Status: AC
  Administered 2022-11-23: 480 mg via INTRAVENOUS
  Filled 2022-11-23: qty 48

## 2022-11-23 MED ORDER — SODIUM CHLORIDE 0.9 % IV SOLN: Freq: Once | INTRAVENOUS | Status: AC

## 2022-11-23 NOTE — Progress Notes (Signed)
Patient has been assessed, vital signs and labs have been reviewed by Dr. Katragadda. ANC, Creatinine, LFTs, and Platelets are within treatment parameters per Dr. Katragadda. The patient is good to proceed with treatment at this time. Primary RN and pharmacy aware.  

## 2022-11-23 NOTE — Patient Instructions (Signed)
[facility]  Discharge Instructions  You were seen and examined today by Dr. Ellin Saba.  Proceed with treatment as scheduled.  Follow-up as scheduled.  Thank you for choosing [facility] to provide your oncology and hematology care.   To afford each patient quality time with our provider, please arrive at least 15 minutes before your scheduled appointment time. You may need to reschedule your appointment if you arrive late (10 or more minutes). Arriving late affects you and other patients whose appointments are after yours.  Also, if you miss three or more appointments without notifying the office, you may be dismissed from the clinic at the provider's discretion.    Again, thank you for choosing [facility].  Our hope is that these requests will decrease the amount of time that you wait before being seen by our physicians.   If you have a lab appointment with the Cancer Center - please note that after April 8th, all labs will be drawn in the cancer center.  You do not have to check in or register with the main entrance as you have in the past but will complete your check-in at the cancer center.            _____________________________________________________________  Should you have questions after your visit to [facility], please contact our office at [phone] and follow the prompts.  Our office hours are 8:00 a.m. to 4:30 p.m. Monday - Thursday and 8:00 a.m. to 2:30 p.m. Friday.  Please note that voicemails left after 4:00 p.m. may not be returned until the following business day.  We are closed weekends and all major holidays.  You do have access to a nurse 24-7, just call the main number to the clinic [phone] and do not press any options, hold on the line and a nurse will answer the phone.    For prescription refill requests, have your pharmacy contact our office and allow 72 hours.    Masks are  no longer required in the cancer centers. If you would like for your care team to wear a mask while they are taking care of you, please let them know. You may have one support person who is at least 57 years old accompany you for your appointments.

## 2022-11-23 NOTE — Patient Instructions (Signed)
[facility]  Discharge Instructions: Thank you for choosing [facility] to provide your oncology and hematology care.  If you have a lab appointment with the Cancer Center - please note that after April 8th, 2024, all labs will be drawn in the cancer center.  You do not have to check in or register with the main entrance as you have in the past but will complete your check-in in the cancer center.  Wear comfortable clothing and clothing appropriate for easy access to any Portacath or PICC line.   We strive to give you quality time with your provider. You may need to reschedule your appointment if you arrive late (15 or more minutes).  Arriving late affects you and other patients whose appointments are after yours.  Also, if you miss three or more appointments without notifying the office, you may be dismissed from the clinic at the provider's discretion.      For prescription refill requests, have your pharmacy contact our office and allow 72 hours for refills to be completed.    Today you received the following chemotherapy and/or immunotherapy agents opdivo   To help prevent nausea and vomiting after your treatment, we encourage you to take your nausea medication as directed.  BELOW ARE SYMPTOMS THAT SHOULD BE REPORTED IMMEDIATELY: *FEVER GREATER THAN 100.4 F (38 C) OR HIGHER *CHILLS OR SWEATING *NAUSEA AND VOMITING THAT IS NOT CONTROLLED WITH YOUR NAUSEA MEDICATION *UNUSUAL SHORTNESS OF BREATH *UNUSUAL BRUISING OR BLEEDING *URINARY PROBLEMS (pain or burning when urinating, or frequent urination) *BOWEL PROBLEMS (unusual diarrhea, constipation, pain near the anus) TENDERNESS IN MOUTH AND THROAT WITH OR WITHOUT PRESENCE OF ULCERS (sore throat, sores in mouth, or a toothache) UNUSUAL RASH, SWELLING OR PAIN  UNUSUAL VAGINAL DISCHARGE OR ITCHING   Items with * indicate a potential emergency and should be followed up as soon as possible or go to the  Emergency Department if any problems should occur.  Please show the CHEMOTHERAPY ALERT CARD or IMMUNOTHERAPY ALERT CARD at check-in to the Emergency Department and triage nurse.  Should you have questions after your visit or need to cancel or reschedule your appointment, please contact [facility] [phone]  and follow the prompts.  Office hours are 8:00 a.m. to 4:30 p.m. Monday - Friday. Please note that voicemails left after 4:00 p.m. may not be returned until the following business day.  We are closed weekends and major holidays. You have access to a nurse at all times for urgent questions. Please call the main number to the clinic [phone] and follow the prompts.  For any non-urgent questions, you may also contact your provider using MyChart. We now offer e-Visits for anyone 18 and older to request care online for non-urgent symptoms. For details visit [email or web address].   Also download the MyChart app! Go to the app store, search "MyChart", open the app, select [place], and log in with your MyChart username and password.   

## 2022-11-23 NOTE — Progress Notes (Signed)
Immunotherapy packet given with all questions asked and answered.  Consent signed.    Patient tolerated therapy with no complaints voiced.  Side effects with management reviewed with understanding verbalized.  Peripheral IV site clean and dry with no bruising or swelling noted at site.  Good blood return noted before and after administration of therapy.  Band aid applied.  Patient left in satisfactory condition with VSS and no s/s of distress noted.

## 2022-11-24 NOTE — Progress Notes (Signed)
24 POST CHEMOTHERAPY CALL BACK:  Left message for patient to return my call.

## 2022-11-28 ENCOUNTER — Ambulatory Visit: Payer: 59

## 2022-11-28 MED ORDER — SODIUM CHLORIDE 0.9 % IV SOLN
200.0000 mg | Freq: Once | INTRAVENOUS | Status: DC
  Filled 2022-11-28: qty 10

## 2022-11-29 ENCOUNTER — Encounter: Payer: Self-pay | Admitting: Diagnostic Radiology

## 2022-11-30 ENCOUNTER — Ambulatory Visit (HOSPITAL_COMMUNITY): Payer: 59

## 2022-12-01 ENCOUNTER — Encounter: Payer: Self-pay | Admitting: Hematology

## 2022-12-01 ENCOUNTER — Encounter: Payer: Self-pay | Admitting: Physician Assistant

## 2022-12-05 ENCOUNTER — Ambulatory Visit (INDEPENDENT_AMBULATORY_CARE_PROVIDER_SITE_OTHER): Payer: 59

## 2022-12-05 VITALS — BP 149/89 | HR 51 | Temp 97.8°F | Resp 12 | Ht 72.0 in | Wt 217.6 lb

## 2022-12-05 DIAGNOSIS — D509 Iron deficiency anemia, unspecified: Secondary | ICD-10-CM

## 2022-12-05 DIAGNOSIS — D508 Other iron deficiency anemias: Secondary | ICD-10-CM

## 2022-12-05 MED ORDER — ACETAMINOPHEN 325 MG PO TABS
650.0000 mg | ORAL_TABLET | Freq: Once | ORAL | Status: AC
  Administered 2022-12-05: 650 mg via ORAL
  Filled 2022-12-05: qty 2

## 2022-12-05 MED ORDER — SODIUM CHLORIDE 0.9 % IV SOLN
200.0000 mg | Freq: Once | INTRAVENOUS | Status: AC
  Administered 2022-12-05: 200 mg via INTRAVENOUS
  Filled 2022-12-05: qty 10

## 2022-12-05 MED ORDER — DIPHENHYDRAMINE HCL 25 MG PO CAPS
25.0000 mg | ORAL_CAPSULE | Freq: Once | ORAL | Status: AC
  Administered 2022-12-05: 25 mg via ORAL
  Filled 2022-12-05: qty 1

## 2022-12-05 NOTE — Progress Notes (Deleted)
Diagnosis: Iron Deficiency Anemia  Provider:  Chilton Greathouse MD  Procedure: IV Infusion  IV Type: Peripheral, IV Location: R Forearm  Venofer (Iron Sucrose), Dose: 200 mg  Infusion Start Time: 0944 am  Infusion Stop Time: 1005 am  Post Infusion IV Care: Observation period completed and Peripheral IV Discontinued  Discharge: Condition: Good, Destination: Home . AVS Declined  Performed by:  Forrest Moron, RN

## 2022-12-05 NOTE — Progress Notes (Signed)
Diagnosis: Iron Deficiency Anemia  Provider:  Chilton Greathouse MD  Procedure: IV Infusion  IV Type: Peripheral, IV Location: R Forearm  Venofer (Iron Sucrose), Dose: 200 mg  Infusion Start Time: 0944  Infusion Stop Time: 1005  Post Infusion IV Care: Patient declined observation and Peripheral IV Discontinued  Discharge: Condition: Good, Destination: Home . AVS Provided  Performed by:  Wyvonne Lenz, RN

## 2022-12-07 ENCOUNTER — Other Ambulatory Visit: Payer: Self-pay

## 2022-12-12 ENCOUNTER — Ambulatory Visit (INDEPENDENT_AMBULATORY_CARE_PROVIDER_SITE_OTHER): Payer: 59

## 2022-12-12 ENCOUNTER — Telehealth: Payer: Self-pay

## 2022-12-12 VITALS — BP 145/92 | HR 49 | Temp 97.9°F | Resp 16 | Ht 72.0 in | Wt 221.8 lb

## 2022-12-12 DIAGNOSIS — D509 Iron deficiency anemia, unspecified: Secondary | ICD-10-CM

## 2022-12-12 DIAGNOSIS — D508 Other iron deficiency anemias: Secondary | ICD-10-CM

## 2022-12-12 MED ORDER — SODIUM CHLORIDE 0.9 % IV SOLN
200.0000 mg | Freq: Once | INTRAVENOUS | Status: AC
  Administered 2022-12-12: 200 mg via INTRAVENOUS
  Filled 2022-12-12: qty 10

## 2022-12-12 MED ORDER — ACETAMINOPHEN 325 MG PO TABS: 650.0000 mg | ORAL_TABLET | Freq: Once | ORAL | Status: DC

## 2022-12-12 MED ORDER — DIPHENHYDRAMINE HCL 25 MG PO CAPS: 25.0000 mg | ORAL_CAPSULE | Freq: Four times a day (QID) | ORAL | Status: DC | PRN

## 2022-12-12 NOTE — Progress Notes (Signed)
Diagnosis: Iron Deficiency Anemia  Provider:  Chilton Greathouse MD  Procedure: IV Infusion  IV Type: Peripheral, IV Location: R Hand  Venofer (Iron Sucrose), Dose: 200 mg  Infusion Start Time: 1505  Infusion Stop Time: 1522  Post Infusion IV Care: Patient declined observation and Peripheral IV Discontinued  Discharge: Condition: Good, Destination: Home . AVS Declined  Performed by:  Wyvonne Lenz, RN

## 2022-12-12 NOTE — Telephone Encounter (Signed)
Auth Submission: NO AUTH NEEDED Site of care: Site of care: [facility] Payer: [employer]  Medication & CPT/J Code(s) submitted: Venofer (Iron Sucrose) J1756 Route of submission (phone, fax, portal): portal Phone # Fax # Auth type: Buy/Bill PB Units/visits requested: 200mg , weekly, 4/10 doses remaining Reference number: [id number] Approval from: 12/12/22 to 04/03/23

## 2022-12-13 ENCOUNTER — Other Ambulatory Visit: Payer: Self-pay

## 2022-12-13 DIAGNOSIS — C641 Malignant neoplasm of right kidney, except renal pelvis: Secondary | ICD-10-CM
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
## 2022-12-14 ENCOUNTER — Inpatient Hospital Stay: Payer: 59 | Attending: Physician Assistant | Admitting: Licensed Clinical Social Worker

## 2022-12-14 ENCOUNTER — Inpatient Hospital Stay: Payer: 59

## 2022-12-14 ENCOUNTER — Inpatient Hospital Stay: Payer: 59 | Admitting: Hematology

## 2022-12-14 ENCOUNTER — Encounter: Payer: Self-pay | Admitting: Licensed Clinical Social Worker

## 2022-12-14 VITALS — BP 137/95 | HR 55 | Temp 98.2°F | Resp 18 | Wt 220.2 lb

## 2022-12-14 DIAGNOSIS — Z5111 Encounter for antineoplastic chemotherapy: Secondary | ICD-10-CM | POA: Insufficient documentation

## 2022-12-14 DIAGNOSIS — C649 Malignant neoplasm of unspecified kidney, except renal pelvis: Secondary | ICD-10-CM

## 2022-12-14 DIAGNOSIS — C7801 Secondary malignant neoplasm of right lung: Secondary | ICD-10-CM | POA: Diagnosis not present

## 2022-12-14 DIAGNOSIS — C7951 Secondary malignant neoplasm of bone: Secondary | ICD-10-CM | POA: Diagnosis not present

## 2022-12-14 DIAGNOSIS — D696 Thrombocytopenia, unspecified: Secondary | ICD-10-CM | POA: Insufficient documentation

## 2022-12-14 DIAGNOSIS — Z9884 Bariatric surgery status: Secondary | ICD-10-CM | POA: Insufficient documentation

## 2022-12-14 DIAGNOSIS — Z800 Family history of malignant neoplasm of digestive organs: Secondary | ICD-10-CM

## 2022-12-14 DIAGNOSIS — R110 Nausea: Secondary | ICD-10-CM | POA: Insufficient documentation

## 2022-12-14 DIAGNOSIS — Z79899 Other long term (current) drug therapy: Secondary | ICD-10-CM | POA: Insufficient documentation

## 2022-12-14 DIAGNOSIS — Z85850 Personal history of malignant neoplasm of thyroid: Secondary | ICD-10-CM

## 2022-12-14 DIAGNOSIS — C641 Malignant neoplasm of right kidney, except renal pelvis: Secondary | ICD-10-CM | POA: Diagnosis present

## 2022-12-14 DIAGNOSIS — C7802 Secondary malignant neoplasm of left lung: Secondary | ICD-10-CM | POA: Insufficient documentation

## 2022-12-14 LAB — COMPREHENSIVE METABOLIC PANEL
ALT: 45 U/L — ABNORMAL HIGH (ref 0–44)
AST: 45 U/L — ABNORMAL HIGH (ref 15–41)
Albumin: 3.5 g/dL (ref 3.5–5.0)
Alkaline Phosphatase: 108 U/L (ref 38–126)
Anion gap: 7 (ref 5–15)
BUN: 18 mg/dL (ref 6–20)
CO2: 27 mmol/L (ref 22–32)
Calcium: 8.6 mg/dL — ABNORMAL LOW (ref 8.9–10.3)
Chloride: 102 mmol/L (ref 98–111)
Creatinine, Ser: 0.96 mg/dL (ref 0.61–1.24)
GFR, Estimated: >60 mL/min (ref >60)
Glucose, Bld: 89 mg/dL (ref 70–99)
Potassium: 4 mmol/L (ref 3.5–5.1)
Sodium: 136 mmol/L (ref 135–145)
Total Bilirubin: 0.6 mg/dL (ref 0.3–1.2)
Total Protein: 7 g/dL (ref 6.5–8.1)

## 2022-12-14 LAB — CBC WITH DIFFERENTIAL/PLATELET
Abs Immature Granulocytes: 0.01 10*3/uL (ref 0.00–0.07)
Basophils Absolute: 0.1 10*3/uL (ref 0.0–0.1)
Basophils Relative: 1 %
Eosinophils Absolute: 1.1 10*3/uL — ABNORMAL HIGH (ref 0.0–0.5)
Eosinophils Relative: 20 %
HCT: 34.2 % — ABNORMAL LOW (ref 39.0–52.0)
Hemoglobin: 11.1 g/dL — ABNORMAL LOW (ref 13.0–17.0)
Immature Granulocytes: 0 %
Lymphocytes Relative: 20 %
Lymphs Abs: 1.1 10*3/uL (ref 0.7–4.0)
MCH: 28.8 pg (ref 26.0–34.0)
MCHC: 32.5 g/dL (ref 30.0–36.0)
MCV: 88.6 fL (ref 80.0–100.0)
Monocytes Absolute: 0.3 10*3/uL (ref 0.1–1.0)
Monocytes Relative: 6 %
Neutro Abs: 3 10*3/uL (ref 1.7–7.7)
Neutrophils Relative %: 53 %
Platelets: 75 10*3/uL — ABNORMAL LOW (ref 150–400)
RBC: 3.86 MIL/uL — ABNORMAL LOW (ref 4.22–5.81)
RDW: 18.9 % — ABNORMAL HIGH (ref 11.5–15.5)
WBC: 5.7 10*3/uL (ref 4.0–10.5)
nRBC: 0 % (ref 0.0–0.2)

## 2022-12-14 LAB — MAGNESIUM: Magnesium: 1.9 mg/dL (ref 1.7–2.4)

## 2022-12-14 MED ORDER — LOSARTAN POTASSIUM 100 MG PO TABS: 100.0000 mg | ORAL_TABLET | Freq: Every day | ORAL | 3 refills | Status: DC

## 2022-12-14 NOTE — Patient Instructions (Addendum)
[facility] at [facility] Discharge Instructions   You were seen and examined today by Dr. Ellin Saba.  He reviewed the results of your lab work which are mostly normal/stable. Your platelet count is low at 75,000. This is likely coming from the Cabometyx you're taking.   Continue Cabometyx as prescribed.   Your blood pressure is elevated at 150/100. Dr. Kirtland Bouchard is increasing the dose of your losartan to 100 mg daily as the Cabometyx may cause your blood pressure to be elevated.   Return as scheduled.    Thank you for choosing [facility] at [facility] to provide your oncology and hematology care.  To afford each patient quality time with our provider, please arrive at least 15 minutes before your scheduled appointment time.   If you have a lab appointment with the Cancer Center please come in thru the Main Entrance and check in at the main information desk.  You need to re-schedule your appointment should you arrive 10 or more minutes late.  We strive to give you quality time with our providers, and arriving late affects you and other patients whose appointments are after yours.  Also, if you no show three or more times for appointments you may be dismissed from the clinic at the providers discretion.     Again, thank you for choosing [facility].  Our hope is that these requests will decrease the amount of time that you wait before being seen by our physicians.       _____________________________________________________________  Should you have questions after your visit to [facility], please contact our office at [phone] and follow the prompts.  Our office hours are 8:00 a.m. and 4:30 p.m. Monday - Friday.  Please note that voicemails left after 4:00 p.m. may not be returned until the following business day.  We are closed weekends and major holidays.  You do have access to a nurse 24-7, just call the main number to  the clinic [phone] and do not press any options, hold on the line and a nurse will answer the phone.    For prescription refill requests, have your pharmacy contact our office and allow 72 hours.    Due to Covid, you will need to wear a mask upon entering the hospital. If you do not have a mask, a mask will be given to you at the Main Entrance upon arrival. For doctor visits, patients may have 1 support person age 87 or older with them. For treatment visits, patients can not have anyone with them due to social distancing guidelines and our immunocompromised population.
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
## 2022-12-14 NOTE — Progress Notes (Signed)
Patient is taking Cabometyx as prescribed.  He has not missed any doses and reports no side effects at this time.

## 2022-12-18 ENCOUNTER — Other Ambulatory Visit: Payer: Self-pay | Admitting: *Deleted

## 2022-12-18 ENCOUNTER — Other Ambulatory Visit: Payer: Self-pay | Admitting: Hematology

## 2022-12-18 MED ORDER — LIDOCAINE VISCOUS HCL 2 % MT SOLN
OROMUCOSAL | 2 refills | Status: DC
Start: 2022-12-18 — End: 2023-09-06

## 2022-12-18 MED ORDER — ALUMINUM & MAGNESIUM HYDROXIDE 200-200 MG/5ML PO SUSP: ORAL | 2 refills | Status: DC

## 2022-12-18 NOTE — Telephone Encounter (Signed)
Wife called to advise that patient is experiencing moth sores secondary to chemotherapy infusions.  Per standing order Maalox and lidocaine 1:1 mixture sent to pharmacy for swish and spit 4 times daily.  Message left for wife with information and callback info.

## 2022-12-21 ENCOUNTER — Inpatient Hospital Stay: Payer: 59

## 2022-12-21 VITALS — BP 146/95 | HR 55 | Temp 97.2°F | Resp 18 | Wt 220.6 lb

## 2022-12-21 DIAGNOSIS — C641 Malignant neoplasm of right kidney, except renal pelvis: Secondary | ICD-10-CM

## 2022-12-21 DIAGNOSIS — Z5111 Encounter for antineoplastic chemotherapy: Secondary | ICD-10-CM | POA: Diagnosis not present

## 2022-12-21 LAB — COMPREHENSIVE METABOLIC PANEL
ALT: 45 U/L — ABNORMAL HIGH (ref 0–44)
AST: 48 U/L — ABNORMAL HIGH (ref 15–41)
Albumin: 3.6 g/dL (ref 3.5–5.0)
Alkaline Phosphatase: 107 U/L (ref 38–126)
Anion gap: 9 (ref 5–15)
BUN: 15 mg/dL (ref 6–20)
CO2: 25 mmol/L (ref 22–32)
Calcium: 8.6 mg/dL — ABNORMAL LOW (ref 8.9–10.3)
Chloride: 105 mmol/L (ref 98–111)
Creatinine, Ser: 0.97 mg/dL (ref 0.61–1.24)
GFR, Estimated: >60 mL/min (ref >60)
Glucose, Bld: 107 mg/dL — ABNORMAL HIGH (ref 70–99)
Potassium: 3.6 mmol/L (ref 3.5–5.1)
Sodium: 139 mmol/L (ref 135–145)
Total Bilirubin: 0.7 mg/dL (ref 0.3–1.2)
Total Protein: 6.7 g/dL (ref 6.5–8.1)

## 2022-12-21 LAB — CBC WITH DIFFERENTIAL/PLATELET
Abs Immature Granulocytes: 0.01 10*3/uL (ref 0.00–0.07)
Basophils Absolute: 0.1 10*3/uL (ref 0.0–0.1)
Basophils Relative: 1 %
Eosinophils Absolute: 1.7 10*3/uL — ABNORMAL HIGH (ref 0.0–0.5)
Eosinophils Relative: 34 %
HCT: 35.4 % — ABNORMAL LOW (ref 39.0–52.0)
Hemoglobin: 11.5 g/dL — ABNORMAL LOW (ref 13.0–17.0)
Immature Granulocytes: 0 %
Lymphocytes Relative: 24 %
Lymphs Abs: 1.2 10*3/uL (ref 0.7–4.0)
MCH: 28.8 pg (ref 26.0–34.0)
MCHC: 32.5 g/dL (ref 30.0–36.0)
MCV: 88.7 fL (ref 80.0–100.0)
Monocytes Absolute: 0.2 10*3/uL (ref 0.1–1.0)
Monocytes Relative: 4 %
Neutro Abs: 1.8 10*3/uL (ref 1.7–7.7)
Neutrophils Relative %: 37 %
Platelets: 66 10*3/uL — ABNORMAL LOW (ref 150–400)
RBC: 3.99 MIL/uL — ABNORMAL LOW (ref 4.22–5.81)
RDW: 19.9 % — ABNORMAL HIGH (ref 11.5–15.5)
WBC: 5 10*3/uL (ref 4.0–10.5)
nRBC: 0 % (ref 0.0–0.2)

## 2022-12-21 LAB — MAGNESIUM: Magnesium: 2 mg/dL (ref 1.7–2.4)

## 2022-12-21 MED ORDER — SODIUM CHLORIDE 0.9 % IV SOLN
480.0000 mg | Freq: Once | INTRAVENOUS | Status: AC
  Administered 2022-12-21: 480 mg via INTRAVENOUS
  Filled 2022-12-21: qty 48

## 2022-12-21 MED ORDER — SODIUM CHLORIDE 0.9 % IV SOLN: Freq: Once | INTRAVENOUS | Status: AC

## 2022-12-21 NOTE — Progress Notes (Signed)
Spoke with patient regarding platelet count of 66,000 on today's labs. Per Dr. Marice Potter instruction, pt is to hold Cabometyx at this time. He will restart at a lower dose once platelet count is above 75,000. He will return on 01/03/23 to have platelet count rechecked and possibly restart Cabometyx at that time. Pt verbalizes understanding of the above.

## 2022-12-21 NOTE — Patient Instructions (Signed)
[facility]  Discharge Instructions: Thank you for choosing [facility] to provide your oncology and hematology care.  If you have a lab appointment with the Cancer Center - please note that after April 8th, 2024, all labs will be drawn in the cancer center.  You do not have to check in or register with the main entrance as you have in the past but will complete your check-in in the cancer center.  Wear comfortable clothing and clothing appropriate for easy access to any Portacath or PICC line.   We strive to give you quality time with your provider. You may need to reschedule your appointment if you arrive late (15 or more minutes).  Arriving late affects you and other patients whose appointments are after yours.  Also, if you miss three or more appointments without notifying the office, you may be dismissed from the clinic at the provider's discretion.      For prescription refill requests, have your pharmacy contact our office and allow 72 hours for refills to be completed.    Today you received the following chemotherapy and/or immunotherapy agents Opdivo.  Nivolumab Injection What is this medication? NIVOLUMAB (nye VOL ue mab) treats some types of cancer. It works by helping your immune system slow or stop the spread of cancer cells. It is a monoclonal antibody. This medicine may be used for other purposes; ask your health care provider or pharmacist if you have questions. COMMON BRAND NAME(S): Opdivo What should I tell my care team before I take this medication? They need to know if you have any of these conditions: Allogeneic stem cell transplant (uses someone else's stem cells) Autoimmune diseases, such as Crohn disease, ulcerative colitis, lupus History of chest radiation Nervous system problems, such as Guillain-Barre syndrome or myasthenia gravis Organ transplant An unusual or allergic reaction to nivolumab, other medications, foods, dyes, or  preservatives Pregnant or trying to get pregnant Breast-feeding How should I use this medication? This medication is infused into a vein. It is given in a hospital or clinic setting. A special MedGuide will be given to you before each treatment. Be sure to read this information carefully each time. Talk to your care team about the use of this medication in children. While it may be prescribed for children as young as 12 years for selected conditions, precautions do apply. Overdosage: If you think you have taken too much of this medicine contact a poison control center or emergency room at once. NOTE: This medicine is only for you. Do not share this medicine with others. What if I miss a dose? Keep appointments for follow-up doses. It is important not to miss your dose. Call your care team if you are unable to keep an appointment. What may interact with this medication? Interactions have not been studied. This list may not describe all possible interactions. Give your health care provider a list of all the medicines, herbs, non-prescription drugs, or dietary supplements you use. Also tell them if you smoke, drink alcohol, or use illegal drugs. Some items may interact with your medicine. What should I watch for while using this medication? Your condition will be monitored carefully while you are receiving this medication. You may need blood work while taking this medication. This medication may cause serious skin reactions. They can happen weeks to months after starting the medication. Contact your care team right away if you notice fevers or flu-like symptoms with a rash. The rash may be red  or purple and then turn into blisters or peeling of the skin. You may also notice a red rash with swelling of the face, lips, or lymph nodes in your neck or under your arms. Tell your care team right away if you have any change in your eyesight. Talk to your care team if you are pregnant or think you might be  pregnant. A negative pregnancy test is required before starting this medication. A reliable form of contraception is recommended while taking this medication and for 5 months after the last dose. Talk to your care team about effective forms of contraception. Do not breast-feed while taking this medication and for 5 months after the last dose. What side effects may I notice from receiving this medication? Side effects that you should report to your care team as soon as possible: Allergic reactions--skin rash, itching, hives, swelling of the face, lips, tongue, or throat Dry cough, shortness of breath or trouble breathing Eye pain, redness, irritation, or discharge with blurry or decreased vision Heart muscle inflammation--unusual weakness or fatigue, shortness of breath, chest pain, fast or irregular heartbeat, dizziness, swelling of the ankles, feet, or hands Hormone gland problems--headache, sensitivity to light, unusual weakness or fatigue, dizziness, fast or irregular heartbeat, increased sensitivity to cold or heat, excessive sweating, constipation, hair loss, increased thirst or amount of urine, tremors or shaking, irritability Infusion reactions--chest pain, shortness of breath or trouble breathing, feeling faint or lightheaded Kidney injury (glomerulonephritis)--decrease in the amount of urine, red or dark George Crane urine, foamy or bubbly urine, swelling of the ankles, hands, or feet Liver injury--right upper belly pain, loss of appetite, nausea, light-colored stool, dark yellow or George Crane urine, yellowing skin or eyes, unusual weakness or fatigue Pain, tingling, or numbness in the hands or feet, muscle weakness, change in vision, confusion or trouble speaking, loss of balance or coordination, trouble walking, seizures Rash, fever, and swollen lymph nodes Redness, blistering, peeling, or loosening of the skin, including inside the mouth Sudden or severe stomach pain, bloody diarrhea, fever, nausea,  vomiting Side effects that usually do not require medical attention (report these to your care team if they continue or are bothersome): Bone, joint, or muscle pain Diarrhea Fatigue Loss of appetite Nausea Skin rash This list may not describe all possible side effects. Call your doctor for medical advice about side effects. You may report side effects to FDA at 1-800-FDA-1088. Where should I keep my medication? This medication is given in a hospital or clinic. It will not be stored at home. NOTE: This sheet is a summary. It may not cover all possible information. If you have questions about this medicine, talk to your doctor, pharmacist, or health care provider.  2024 Elsevier/Gold Standard (2021-07-18 00:00:00)       To help prevent nausea and vomiting after your treatment, we encourage you to take your nausea medication as directed.  BELOW ARE SYMPTOMS THAT SHOULD BE REPORTED IMMEDIATELY: *FEVER GREATER THAN 100.4 F (38 C) OR HIGHER *CHILLS OR SWEATING *NAUSEA AND VOMITING THAT IS NOT CONTROLLED WITH YOUR NAUSEA MEDICATION *UNUSUAL SHORTNESS OF BREATH *UNUSUAL BRUISING OR BLEEDING *URINARY PROBLEMS (pain or burning when urinating, or frequent urination) *BOWEL PROBLEMS (unusual diarrhea, constipation, pain near the anus) TENDERNESS IN MOUTH AND THROAT WITH OR WITHOUT PRESENCE OF ULCERS (sore throat, sores in mouth, or a toothache) UNUSUAL RASH, SWELLING OR PAIN  UNUSUAL VAGINAL DISCHARGE OR ITCHING   Items with * indicate a potential emergency and should be followed up as soon as possible  or go to the Emergency Department if any problems should occur.  Please show the CHEMOTHERAPY ALERT CARD or IMMUNOTHERAPY ALERT CARD at check-in to the Emergency Department and triage nurse.  Should you have questions after your visit or need to cancel or reschedule your appointment, please contact [facility] [phone]  and follow the prompts.  Office hours are 8:00  a.m. to 4:30 p.m. Monday - Friday. Please note that voicemails left after 4:00 p.m. may not be returned until the following business day.  We are closed weekends and major holidays. You have access to a nurse at all times for urgent questions. Please call the main number to the clinic [phone] and follow the prompts.  For any non-urgent questions, you may also contact your provider using MyChart. We now offer e-Visits for anyone 62 and older to request care online for non-urgent symptoms. For details visit [email or web address].   Also download the MyChart app! Go to the app store, search "MyChart", open the app, select [place], and log in with your MyChart username and password.

## 2022-12-21 NOTE — Progress Notes (Signed)
Patient presents today for chemotherapy infusion.  Patient is in satisfactory condition with no new complaints voiced.  Vital signs are stable.  Labs reviewed.  Platelets today are 66.  Dr. Anders Simmonds made aware. We will proceed with treatment per MD orders.  Patient tolerated treatment well with no complaints voiced.  Patient left ambulatory in stable condition.  Vital signs stable at discharge.  Follow up as scheduled.

## 2022-12-22 ENCOUNTER — Other Ambulatory Visit: Payer: Self-pay

## 2022-12-22 MED ORDER — HYDROCODONE-ACETAMINOPHEN 10-325 MG PO TABS: 1.0000 | ORAL_TABLET | Freq: Four times a day (QID) | ORAL | 0 refills | Status: DC | PRN

## 2022-12-25 ENCOUNTER — Encounter: Payer: Self-pay | Admitting: Licensed Clinical Social Worker

## 2022-12-25 ENCOUNTER — Telehealth: Payer: Self-pay | Admitting: Licensed Clinical Social Worker

## 2022-12-25 ENCOUNTER — Ambulatory Visit: Payer: Self-pay | Admitting: Licensed Clinical Social Worker

## 2022-12-25 DIAGNOSIS — Z1379 Encounter for other screening for genetic and chromosomal anomalies: Secondary | ICD-10-CM

## 2022-12-25 NOTE — Progress Notes (Signed)
HPI:   George Crane was previously seen in the [facility] due to a personal and family history of cancer and concerns regarding a hereditary predisposition to cancer. Please refer to our prior cancer genetics clinic note for more information regarding our discussion, assessment and recommendations, at the time. George Crane recent genetic test results were disclosed to him, as were recommendations warranted by these results. These results and recommendations are discussed in more detail below.  CANCER HISTORY:  Oncology History  Renal cell carcinoma (HCC)  11/10/2022 Initial Diagnosis   Renal cell carcinoma (HCC)   11/10/2022 Cancer Staging   Staging form: Kidney, AJCC 8th Edition - Clinical stage from 11/10/2022: Stage IV (cT1b, cN1, cM1) - Signed by Jaci Standard, MD on 11/10/2022 Stage prefix: Initial diagnosis   11/23/2022 -  Chemotherapy   Patient is on Treatment Plan : RENAL CELL Nivolumab (480) q28d      Genetic Testing   No pathogenic variants identified on the [facility] Multi-Cancer+RNA panel. VUS in POLE called c.431A>G identified. The report date is 12/21/2022.  The Multi-Cancer + RNA Panel offered by [facility] includes sequencing and/or deletion/duplication analysis of the following 70 genes:  AIP*, ALK, APC*, ATM*, AXIN2*, BAP1*, BARD1*, BLM*, BMPR1A*, BRCA1*, BRCA2*, BRIP1*, CDC73*, CDH1*, CDK4, CDKN1B*, CDKN2A, CHEK2*, CTNNA1*, DICER1*, EPCAM, EGFR, FH*, FLCN*, GREM1, HOXB13, KIT, LZTR1, MAX*, MBD4, MEN1*, MET, MITF, MLH1*, MSH2*, MSH3*, MSH6*, MUTYH*, NF1*, NF2*, NTHL1*, PALB2*, PDGFRA, PMS2*, POLD1*, POLE*, POT1*, PRKAR1A*, PTCH1*, PTEN*, RAD51C*, RAD51D*, RB1*, RET, SDHA*, SDHAF2*, SDHB*, SDHC*, SDHD*, SMAD4*, SMARCA4*, SMARCB1*, SMARCE1*, STK11*, SUFU*, TMEM127*, TP53*, TSC1*, TSC2*, VHL*. RNA analysis is performed for * genes.     FAMILY HISTORY:  We obtained a detailed, 4-generation family history.  Significant diagnoses are listed below: Family History   Problem Relation Age of Onset   Lung cancer Mother 16       distant smoking hx   Colon cancer Father 47   Skin cancer Brother        non-melanoma   Stomach cancer Maternal Aunt    Cancer Paternal Uncle        heme malignancy    George Crane has 1 adopted son. He has 2 brothers, 1 sister. His brother has had non-melanoma skin cancer on his scalp.    George Crane mother had lung cancer that was metastatic, she passed at 43. Maternal aunt had stomach cancer/tumor. No other known cancers on this side of the family.   George Crane father had colon cancer at 85 and passed at 61. A paternal uncle had a hematological malignancy. No other known cancers on this side of the family.   George Crane is unaware of previous family history of genetic testing for hereditary cancer risks. There is no reported Ashkenazi Jewish ancestry. There is no known consanguinity.     GENETIC TEST RESULTS:  The [facility] Multi-Cancer+RNA Panel found no pathogenic mutations.  The Multi-Cancer + RNA Panel offered by [facility] includes sequencing and/or deletion/duplication analysis of the following 70 genes:  AIP*, ALK, APC*, ATM*, AXIN2*, BAP1*, BARD1*, BLM*, BMPR1A*, BRCA1*, BRCA2*, BRIP1*, CDC73*, CDH1*, CDK4, CDKN1B*, CDKN2A, CHEK2*, CTNNA1*, DICER1*, EPCAM, EGFR, FH*, FLCN*, GREM1, HOXB13, KIT, LZTR1, MAX*, MBD4, MEN1*, MET, MITF, MLH1*, MSH2*, MSH3*, MSH6*, MUTYH*, NF1*, NF2*, NTHL1*, PALB2*, PDGFRA, PMS2*, POLD1*, POLE*, POT1*, PRKAR1A*, PTCH1*, PTEN*, RAD51C*, RAD51D*, RB1*, RET, SDHA*, SDHAF2*, SDHB*, SDHC*, SDHD*, SMAD4*, SMARCA4*, SMARCB1*, SMARCE1*, STK11*, SUFU*, TMEM127*, TP53*, TSC1*, TSC2*, VHL*. RNA analysis is performed for * genes.  The test report  has been scanned into EPIC and is located under the Molecular Pathology section of the Results Review tab.  A portion of the result report is included below for reference. Genetic testing reported out on 12/21/2022.      Genetic testing identified a variant of  uncertain significance (VUS) in the POLE gene called c.431A>G.  At this time, it is unknown if this variant is associated with an increased risk for cancer or if it is benign, but most uncertain variants are reclassified to benign. It should not be used to make medical management decisions. With time, we suspect the laboratory will determine the significance of this variant, if any. If the laboratory reclassifies this variant, we will attempt to contact George Crane to discuss it further.   Even though a pathogenic variant was not identified, possible explanations for the cancer in the family may include: There may be no hereditary risk for cancer in the family. The cancers in George Crane and/or his family may be sporadic/familial or due to other genetic and environmental factors. There may be a gene mutation in one of these genes that current testing methods cannot detect but that chance is small. There could be another gene that has not yet been discovered, or that we have not yet tested, that is responsible for the cancer diagnoses in the family.  It is also possible there is a hereditary cause for the cancer in the family that George Crane did not inherit.  Therefore, it is important to remain in touch with cancer genetics in the future so that we can continue to offer George Crane the most up to date genetic testing.   ADDITIONAL GENETIC TESTING:  We discussed with George Crane that his genetic testing was fairly extensive.  If there are additional relevant genes identified to increase cancer risk that can be analyzed in the future, we would be happy to discuss and coordinate this testing at that time.    CANCER SCREENING RECOMMENDATIONS:  George Crane test result is considered negative (normal).  This means that we have not identified a hereditary cause for his personal and family history of cancer at this time.   An individual's cancer risk and medical management are not determined by genetic test results  alone. Overall cancer risk assessment incorporates additional factors, including personal medical history, family history, and any available genetic information that may result in a personalized plan for cancer prevention and surveillance. Therefore, it is recommended he continue to follow the cancer management and screening guidelines provided by his oncology and primary healthcare provider.  RECOMMENDATIONS FOR FAMILY MEMBERS:   Individuals in this family might be at some increased risk of developing cancer, over the general population risk, due to the family history of cancer.  Individuals in the family should notify their providers of the family history of cancer. We recommend women in this family have a yearly mammogram beginning at age 86, or 49 years younger than the earliest onset of cancer, an annual clinical breast exam, and perform monthly breast self-exams.  Family members should have colonoscopies by at age 49, or earlier, as recommended by their providers. We do not recommend familial testing for the POLE variant of uncertain significance (VUS).  FOLLOW-UP:  Lastly, we discussed with George Crane that cancer genetics is a rapidly advancing field and it is possible that new genetic tests will be appropriate for him and/or his family members in the future. We encouraged him to remain in contact with cancer genetics on  an annual basis so we can update his personal and family histories and let him know of advances in cancer genetics that may benefit this family.   Our contact number was provided. George Crane questions were answered to his satisfaction, and he knows he is welcome to call us at anytime with additional questions or concerns.    Lacy Duverney, MS, [facility] Genetic Counselor [email or web address] Phone: [phone]

## 2022-12-25 NOTE — Telephone Encounter (Signed)
I contacted Mr. Birchard to discuss his genetic testing results. No pathogenic variants were identified in the 70 genes analyzed. Detailed clinic note to follow.   The test report has been scanned into EPIC and is located under the Molecular Pathology section of the Results Review tab.  A portion of the result report is included below for reference.      Lacy Duverney, MS, [facility] Genetic Counselor [email or web address] Phone: [phone]

## 2023-01-02 ENCOUNTER — Other Ambulatory Visit: Payer: Self-pay

## 2023-01-03 ENCOUNTER — Inpatient Hospital Stay: Payer: 59

## 2023-01-04 ENCOUNTER — Other Ambulatory Visit: Payer: 59

## 2023-01-04 ENCOUNTER — Encounter: Payer: Self-pay | Admitting: *Deleted

## 2023-01-10 ENCOUNTER — Encounter: Payer: Self-pay | Admitting: Hematology and Oncology

## 2023-01-18 ENCOUNTER — Other Ambulatory Visit: Payer: Self-pay

## 2023-01-18 ENCOUNTER — Inpatient Hospital Stay: Payer: 59

## 2023-01-18 ENCOUNTER — Inpatient Hospital Stay: Payer: 59 | Attending: Physician Assistant

## 2023-01-18 ENCOUNTER — Inpatient Hospital Stay: Payer: 59 | Admitting: Hematology

## 2023-01-18 VITALS — BP 151/100 | HR 60 | Temp 96.7°F | Resp 18

## 2023-01-18 VITALS — BP 163/104 | HR 55 | Temp 97.9°F | Resp 16 | Wt 217.3 lb

## 2023-01-18 DIAGNOSIS — Z79899 Other long term (current) drug therapy: Secondary | ICD-10-CM | POA: Insufficient documentation

## 2023-01-18 DIAGNOSIS — C641 Malignant neoplasm of right kidney, except renal pelvis: Secondary | ICD-10-CM

## 2023-01-18 DIAGNOSIS — Z800 Family history of malignant neoplasm of digestive organs: Secondary | ICD-10-CM | POA: Insufficient documentation

## 2023-01-18 DIAGNOSIS — Z801 Family history of malignant neoplasm of trachea, bronchus and lung: Secondary | ICD-10-CM | POA: Diagnosis not present

## 2023-01-18 DIAGNOSIS — Z5111 Encounter for antineoplastic chemotherapy: Secondary | ICD-10-CM | POA: Diagnosis present

## 2023-01-18 DIAGNOSIS — C7951 Secondary malignant neoplasm of bone: Secondary | ICD-10-CM | POA: Diagnosis not present

## 2023-01-18 LAB — CBC WITH DIFFERENTIAL/PLATELET
Abs Immature Granulocytes: 0.01 10*3/uL (ref 0.00–0.07)
Basophils Absolute: 0.1 10*3/uL (ref 0.0–0.1)
Basophils Relative: 1 %
Eosinophils Absolute: 0.5 10*3/uL (ref 0.0–0.5)
Eosinophils Relative: 10 %
HCT: 40.3 % (ref 39.0–52.0)
Hemoglobin: 13.6 g/dL (ref 13.0–17.0)
Immature Granulocytes: 0 %
Lymphocytes Relative: 26 %
Lymphs Abs: 1.3 10*3/uL (ref 0.7–4.0)
MCH: 31.4 pg (ref 26.0–34.0)
MCHC: 33.7 g/dL (ref 30.0–36.0)
MCV: 93.1 fL (ref 80.0–100.0)
Monocytes Absolute: 0.2 10*3/uL (ref 0.1–1.0)
Monocytes Relative: 4 %
Neutro Abs: 2.9 10*3/uL (ref 1.7–7.7)
Neutrophils Relative %: 59 %
Platelets: 152 10*3/uL (ref 150–400)
RBC: 4.33 MIL/uL (ref 4.22–5.81)
RDW: 19.3 % — ABNORMAL HIGH (ref 11.5–15.5)
WBC: 5 10*3/uL (ref 4.0–10.5)
nRBC: 0 % (ref 0.0–0.2)

## 2023-01-18 LAB — COMPREHENSIVE METABOLIC PANEL
ALT: 42 U/L (ref 0–44)
AST: 45 U/L — ABNORMAL HIGH (ref 15–41)
Albumin: 4.3 g/dL (ref 3.5–5.0)
Alkaline Phosphatase: 90 U/L (ref 38–126)
Anion gap: 8 (ref 5–15)
BUN: 17 mg/dL (ref 6–20)
CO2: 27 mmol/L (ref 22–32)
Calcium: 9 mg/dL (ref 8.9–10.3)
Chloride: 104 mmol/L (ref 98–111)
Creatinine, Ser: 0.95 mg/dL (ref 0.61–1.24)
GFR, Estimated: >60 mL/min (ref >60)
Glucose, Bld: 78 mg/dL (ref 70–99)
Potassium: 4.2 mmol/L (ref 3.5–5.1)
Sodium: 139 mmol/L (ref 135–145)
Total Bilirubin: 0.5 mg/dL (ref 0.3–1.2)
Total Protein: 7.7 g/dL (ref 6.5–8.1)

## 2023-01-18 LAB — MAGNESIUM: Magnesium: 2.1 mg/dL (ref 1.7–2.4)

## 2023-01-18 LAB — TSH: TSH: 5.532 u[IU]/mL — ABNORMAL HIGH (ref 0.350–4.500)

## 2023-01-18 MED ORDER — CABOZANTINIB S-MALATE 40 MG PO TABS: 40.0000 mg | ORAL_TABLET | Freq: Every day | ORAL | 2 refills | Status: DC

## 2023-01-18 MED ORDER — SODIUM CHLORIDE 0.9 % IV SOLN: Freq: Once | INTRAVENOUS | Status: AC

## 2023-01-18 MED ORDER — SODIUM CHLORIDE 0.9 % IV SOLN
480.0000 mg | Freq: Once | INTRAVENOUS | Status: AC
  Administered 2023-01-18: 480 mg via INTRAVENOUS
  Filled 2023-01-18: qty 48

## 2023-01-18 NOTE — Patient Instructions (Signed)
[facility]  Discharge Instructions  You were seen and examined today by Dr. Ellin Saba.  Dr. Ellin Saba discussed your most recent lab work. Your labs are stable. Proceed with treatment as planned today.  Continue Cabometyx as prescribed.  Follow-up as scheduled.    Thank you for choosing [facility] to provide your oncology and hematology care.   To afford each patient quality time with our provider, please arrive at least 15 minutes before your scheduled appointment time. You may need to reschedule your appointment if you arrive late (10 or more minutes). Arriving late affects you and other patients whose appointments are after yours.  Also, if you miss three or more appointments without notifying the office, you may be dismissed from the clinic at the provider's discretion.    Again, thank you for choosing [facility].  Our hope is that these requests will decrease the amount of time that you wait before being seen by our physicians.   If you have a lab appointment with the Cancer Center - please note that after April 8th, all labs will be drawn in the cancer center.  You do not have to check in or register with the main entrance as you have in the past but will complete your check-in at the cancer center.            _____________________________________________________________  Should you have questions after your visit to [facility], please contact our office at [phone] and follow the prompts.  Our office hours are 8:00 a.m. to 4:30 p.m. Monday - Thursday and 8:00 a.m. to 2:30 p.m. Friday.  Please note that voicemails left after 4:00 p.m. may not be returned until the following business day.  We are closed weekends and all major holidays.  You do have access to a nurse 24-7, just call the main number to the clinic [phone] and do not press any options, hold on the line and a nurse will answer the  phone.    For prescription refill requests, have your pharmacy contact our office and allow 72 hours.    Masks are no longer required in the cancer centers. If you would like for your care team to wear a mask while they are taking care of you, please let them know. You may have one support person who is at least 57 years old accompany you for your appointments.
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
## 2023-01-18 NOTE — Patient Instructions (Signed)
[facility]  Discharge Instructions: Thank you for choosing [facility] to provide your oncology and hematology care.  If you have a lab appointment with the Cancer Center - please note that after April 8th, 2024, all labs will be drawn in the cancer center.  You do not have to check in or register with the main entrance as you have in the past but will complete your check-in in the cancer center.  Wear comfortable clothing and clothing appropriate for easy access to any Portacath or PICC line.   We strive to give you quality time with your provider. You may need to reschedule your appointment if you arrive late (15 or more minutes).  Arriving late affects you and other patients whose appointments are after yours.  Also, if you miss three or more appointments without notifying the office, you may be dismissed from the clinic at the provider's discretion.      For prescription refill requests, have your pharmacy contact our office and allow 72 hours for refills to be completed.    Today you received the following chemotherapy and/or immunotherapy agents Opdivo   To help prevent nausea and vomiting after your treatment, we encourage you to take your nausea medication as directed.  Nivolumab Injection What is this medication? NIVOLUMAB (nye VOL ue mab) treats some types of cancer. It works by helping your immune system slow or stop the spread of cancer cells. It is a monoclonal antibody. This medicine may be used for other purposes; ask your health care provider or pharmacist if you have questions. COMMON BRAND NAME(S): Opdivo What should I tell my care team before I take this medication? They need to know if you have any of these conditions: Allogeneic stem cell transplant (uses someone else's stem cells) Autoimmune diseases, such as Crohn disease, ulcerative colitis, lupus History of chest radiation Nervous system problems, such as Guillain-Barre syndrome or  myasthenia gravis Organ transplant An unusual or allergic reaction to nivolumab, other medications, foods, dyes, or preservatives Pregnant or trying to get pregnant Breast-feeding How should I use this medication? This medication is infused into a vein. It is given in a hospital or clinic setting. A special MedGuide will be given to you before each treatment. Be sure to read this information carefully each time. Talk to your care team about the use of this medication in children. While it may be prescribed for children as young as 12 years for selected conditions, precautions do apply. Overdosage: If you think you have taken too much of this medicine contact a poison control center or emergency room at once. NOTE: This medicine is only for you. Do not share this medicine with others. What if I miss a dose? Keep appointments for follow-up doses. It is important not to miss your dose. Call your care team if you are unable to keep an appointment. What may interact with this medication? Interactions have not been studied. This list may not describe all possible interactions. Give your health care provider a list of all the medicines, herbs, non-prescription drugs, or dietary supplements you use. Also tell them if you smoke, drink alcohol, or use illegal drugs. Some items may interact with your medicine. What should I watch for while using this medication? Your condition will be monitored carefully while you are receiving this medication. You may need blood work while taking this medication. This medication may cause serious skin reactions. They can happen weeks to months after starting the medication.  Contact your care team right away if you notice fevers or flu-like symptoms with a rash. The rash may be red or purple and then turn into blisters or peeling of the skin. You may also notice a red rash with swelling of the face, lips, or lymph nodes in your neck or under your arms. Tell your care team  right away if you have any change in your eyesight. Talk to your care team if you are pregnant or think you might be pregnant. A negative pregnancy test is required before starting this medication. A reliable form of contraception is recommended while taking this medication and for 5 months after the last dose. Talk to your care team about effective forms of contraception. Do not breast-feed while taking this medication and for 5 months after the last dose. What side effects may I notice from receiving this medication? Side effects that you should report to your care team as soon as possible: Allergic reactions--skin rash, itching, hives, swelling of the face, lips, tongue, or throat Dry cough, shortness of breath or trouble breathing Eye pain, redness, irritation, or discharge with blurry or decreased vision Heart muscle inflammation--unusual weakness or fatigue, shortness of breath, chest pain, fast or irregular heartbeat, dizziness, swelling of the ankles, feet, or hands Hormone gland problems--headache, sensitivity to light, unusual weakness or fatigue, dizziness, fast or irregular heartbeat, increased sensitivity to cold or heat, excessive sweating, constipation, hair loss, increased thirst or amount of urine, tremors or shaking, irritability Infusion reactions--chest pain, shortness of breath or trouble breathing, feeling faint or lightheaded Kidney injury (glomerulonephritis)--decrease in the amount of urine, red or dark brown urine, foamy or bubbly urine, swelling of the ankles, hands, or feet Liver injury--right upper belly pain, loss of appetite, nausea, light-colored stool, dark yellow or brown urine, yellowing skin or eyes, unusual weakness or fatigue Pain, tingling, or numbness in the hands or feet, muscle weakness, change in vision, confusion or trouble speaking, loss of balance or coordination, trouble walking, seizures Rash, fever, and swollen lymph nodes Redness, blistering, peeling,  or loosening of the skin, including inside the mouth Sudden or severe stomach pain, bloody diarrhea, fever, nausea, vomiting Side effects that usually do not require medical attention (report these to your care team if they continue or are bothersome): Bone, joint, or muscle pain Diarrhea Fatigue Loss of appetite Nausea Skin rash This list may not describe all possible side effects. Call your doctor for medical advice about side effects. You may report side effects to FDA at 1-800-FDA-1088. Where should I keep my medication? This medication is given in a hospital or clinic. It will not be stored at home. NOTE: This sheet is a summary. It may not cover all possible information. If you have questions about this medicine, talk to your doctor, pharmacist, or health care provider.  2024 Elsevier/Gold Standard (2021-07-18 00:00:00)   BELOW ARE SYMPTOMS THAT SHOULD BE REPORTED IMMEDIATELY: *FEVER GREATER THAN 100.4 F (38 C) OR HIGHER *CHILLS OR SWEATING *NAUSEA AND VOMITING THAT IS NOT CONTROLLED WITH YOUR NAUSEA MEDICATION *UNUSUAL SHORTNESS OF BREATH *UNUSUAL BRUISING OR BLEEDING *URINARY PROBLEMS (pain or burning when urinating, or frequent urination) *BOWEL PROBLEMS (unusual diarrhea, constipation, pain near the anus) TENDERNESS IN MOUTH AND THROAT WITH OR WITHOUT PRESENCE OF ULCERS (sore throat, sores in mouth, or a toothache) UNUSUAL RASH, SWELLING OR PAIN  UNUSUAL VAGINAL DISCHARGE OR ITCHING   Items with * indicate a potential emergency and should be followed up as soon as possible or go to  the Emergency Department if any problems should occur.  Please show the CHEMOTHERAPY ALERT CARD or IMMUNOTHERAPY ALERT CARD at check-in to the Emergency Department and triage nurse.  Should you have questions after your visit or need to cancel or reschedule your appointment, please contact [facility] [phone]  and follow the prompts.  Office hours are 8:00 a.m. to 4:30  p.m. Monday - Friday. Please note that voicemails left after 4:00 p.m. may not be returned until the following business day.  We are closed weekends and major holidays. You have access to a nurse at all times for urgent questions. Please call the main number to the clinic [phone] and follow the prompts.  For any non-urgent questions, you may also contact your provider using MyChart. We now offer e-Visits for anyone 1 and older to request care online for non-urgent symptoms. For details visit [email or web address].   Also download the MyChart app! Go to the app store, search "MyChart", open the app, select [place], and log in with your MyChart username and password.

## 2023-01-18 NOTE — Progress Notes (Addendum)
Patient presents today for Opdivo infusion. Patient is in satisfactory condition with no new complaints voiced.  Vital signs are stable.  Labs reviewed by Dr. Ellin Saba during the office visit and all labs are within treatment parameters.  We will proceed with treatment per MD orders.   Peripheral IV started with good blood return pre and post infusion.   Treatment given today per MD orders. Tolerated infusion without adverse affects. Vital signs stable. No complaints at this time. Discharged from clinic ambulatory in stable condition. Alert and oriented x 3. F/U with [facility] as scheduled.

## 2023-01-18 NOTE — Progress Notes (Signed)
Patient is taking Cabometyx as prescribed. He has not missed any doses since restarting on October 3rd and reports no side effects at this time.   Patient has been assessed, vital signs and labs have been reviewed by Dr. Ellin Saba. ANC, Creatinine, LFTs, and Platelets are within treatment parameters per Dr. Ellin Saba. The patient is good to proceed with treatment at this time. Primary RN and pharmacy aware.

## 2023-01-19 LAB — T4: T4, Total: 7.3 ug/dL (ref 4.5–12.0)

## 2023-01-24 ENCOUNTER — Other Ambulatory Visit: Payer: Self-pay

## 2023-01-24 MED ORDER — AMLODIPINE BESYLATE 10 MG PO TABS: 10.0000 mg | ORAL_TABLET | Freq: Every day | ORAL | 1 refills | Status: DC

## 2023-01-30 ENCOUNTER — Other Ambulatory Visit: Payer: Self-pay

## 2023-01-31 ENCOUNTER — Other Ambulatory Visit: Payer: Self-pay | Admitting: Oncology

## 2023-02-01 ENCOUNTER — Other Ambulatory Visit (HOSPITAL_COMMUNITY): Payer: Self-pay

## 2023-02-01 ENCOUNTER — Other Ambulatory Visit: Payer: Self-pay | Admitting: *Deleted

## 2023-02-01 MED ORDER — HYDROCODONE-ACETAMINOPHEN 10-325 MG PO TABS
1.0000 | ORAL_TABLET | Freq: Four times a day (QID) | ORAL | 0 refills | Status: DC | PRN
  Filled 2023-02-01: qty 90, 23d supply, fill #0

## 2023-02-02 ENCOUNTER — Other Ambulatory Visit (HOSPITAL_COMMUNITY): Payer: Self-pay

## 2023-02-05 ENCOUNTER — Ambulatory Visit (HOSPITAL_COMMUNITY): Admission: RE | Admit: 2023-02-05 | Payer: 59 | Source: Ambulatory Visit

## 2023-02-07 ENCOUNTER — Ambulatory Visit (HOSPITAL_COMMUNITY)
Admission: RE | Admit: 2023-02-07 | Discharge: 2023-02-07 | Disposition: A | Discharge status: Home / Self Care | Payer: 59 | Source: Ambulatory Visit | Attending: Hematology | Admitting: Hematology

## 2023-02-07 DIAGNOSIS — C641 Malignant neoplasm of right kidney, except renal pelvis: Secondary | ICD-10-CM | POA: Diagnosis present

## 2023-02-07 MED ORDER — IOHEXOL 300 MG/ML  SOLN
100.0000 mL | Freq: Once | INTRAMUSCULAR | Status: AC | PRN
  Administered 2023-02-07: 100 mL via INTRAVENOUS

## 2023-02-12 ENCOUNTER — Other Ambulatory Visit: Payer: Self-pay

## 2023-02-13 ENCOUNTER — Other Ambulatory Visit: Payer: Self-pay

## 2023-02-13 DIAGNOSIS — C641 Malignant neoplasm of right kidney, except renal pelvis: Secondary | ICD-10-CM

## 2023-02-14 ENCOUNTER — Other Ambulatory Visit: Payer: Self-pay | Admitting: *Deleted

## 2023-02-14 ENCOUNTER — Inpatient Hospital Stay: Payer: 59

## 2023-02-14 ENCOUNTER — Inpatient Hospital Stay: Payer: 59 | Attending: Physician Assistant | Admitting: Hematology

## 2023-02-14 ENCOUNTER — Inpatient Hospital Stay: Payer: 59 | Admitting: Hematology

## 2023-02-14 DIAGNOSIS — I10 Essential (primary) hypertension: Secondary | ICD-10-CM | POA: Insufficient documentation

## 2023-02-14 DIAGNOSIS — D649 Anemia, unspecified: Secondary | ICD-10-CM

## 2023-02-14 DIAGNOSIS — R918 Other nonspecific abnormal finding of lung field: Secondary | ICD-10-CM | POA: Insufficient documentation

## 2023-02-14 DIAGNOSIS — C649 Malignant neoplasm of unspecified kidney, except renal pelvis: Secondary | ICD-10-CM

## 2023-02-14 DIAGNOSIS — C641 Malignant neoplasm of right kidney, except renal pelvis: Secondary | ICD-10-CM | POA: Insufficient documentation

## 2023-02-14 DIAGNOSIS — Z79899 Other long term (current) drug therapy: Secondary | ICD-10-CM | POA: Insufficient documentation

## 2023-02-14 DIAGNOSIS — Z5111 Encounter for antineoplastic chemotherapy: Secondary | ICD-10-CM | POA: Insufficient documentation

## 2023-02-14 MED ORDER — HYDROCHLOROTHIAZIDE 25 MG PO TABS: 25.0000 mg | ORAL_TABLET | Freq: Every day | ORAL | 3 refills | Status: DC
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
## 2023-02-14 NOTE — Progress Notes (Signed)
Virtual Visit via Telephone Note  I connected with George Crane on 02/14/23 at  4:15 PM EST by telephone and verified that I am speaking with the correct person using two identifiers.  Location: Patient: At home Provider: In the office   I discussed the limitations, risks, security and privacy concerns of performing an evaluation and management service by telephone and the availability of in person appointments. I also discussed with the patient that there may be a patient responsible charge related to this service. The patient expressed understanding and agreed to proceed.   History of Present Illness: Mr. Loatman is seen in our clinic for metastatic papillary renal cell carcinoma diagnosed in August 2024.  He has been started on cabozantinib and nivolumab on 11/16/2022.   Observations/Objective: He denies any GI side effects.  No skin rashes reported.  Left shoulder pain is reported as 5 out of 10.  Well-controlled with pain medication.  Reports systolic blood pressure ranging between 140-150 and diastolic blood pressure 90-99.  He is already taking 2 blood pressure medications.  Assessment and Plan:  1.  Metastatic papillary renal cell carcinoma: - Reviewed CT CAP (02/07/2023): Decrease in size of the right kidney mass, bilateral lung nodules, thoracic lymph nodes and retroperitoneal lymph nodes.  No new evidence of progression. - Continue cabozantinib 40 mg daily. - He missed his appointment today.  He will come back next week to receive his nivolumab. - We will do labs when he comes back for nivolumab next week.  RTC 5 weeks for follow-up.  2.  Hypertension: - He is on Norvasc 10 mg daily and losartan 100 mg daily.  Blood pressure at home is 140-150/90-99. - Will start him on hydrochlorothiazide 25 mg daily.   Follow Up Instructions:  RTC 5 weeks for follow-up. I discussed the assessment and treatment plan with the patient. The patient was provided an opportunity to ask questions  and all were answered. The patient agreed with the plan and demonstrated an understanding of the instructions.   The patient was advised to call back or seek an in-person evaluation if the symptoms worsen or if the condition fails to improve as anticipated.  I provided 25 minutes of non-face-to-face time during this encounter.   [name], MD

## 2023-02-18 ENCOUNTER — Other Ambulatory Visit: Payer: Self-pay

## 2023-02-19 ENCOUNTER — Other Ambulatory Visit: Payer: Self-pay

## 2023-02-19 DIAGNOSIS — Z85850 Personal history of malignant neoplasm of thyroid: Secondary | ICD-10-CM

## 2023-02-19 DIAGNOSIS — C649 Malignant neoplasm of unspecified kidney, except renal pelvis: Secondary | ICD-10-CM

## 2023-02-19 NOTE — Telephone Encounter (Signed)
Please advise 

## 2023-02-20 ENCOUNTER — Inpatient Hospital Stay: Payer: 59

## 2023-02-20 VITALS — BP 141/97 | HR 59 | Temp 97.5°F | Resp 16 | Ht 72.0 in | Wt 222.1 lb

## 2023-02-20 DIAGNOSIS — R918 Other nonspecific abnormal finding of lung field: Secondary | ICD-10-CM | POA: Diagnosis not present

## 2023-02-20 DIAGNOSIS — Z85850 Personal history of malignant neoplasm of thyroid: Secondary | ICD-10-CM

## 2023-02-20 DIAGNOSIS — Z79899 Other long term (current) drug therapy: Secondary | ICD-10-CM | POA: Diagnosis not present

## 2023-02-20 DIAGNOSIS — C641 Malignant neoplasm of right kidney, except renal pelvis: Secondary | ICD-10-CM

## 2023-02-20 DIAGNOSIS — D649 Anemia, unspecified: Secondary | ICD-10-CM

## 2023-02-20 DIAGNOSIS — I10 Essential (primary) hypertension: Secondary | ICD-10-CM | POA: Diagnosis not present

## 2023-02-20 DIAGNOSIS — C649 Malignant neoplasm of unspecified kidney, except renal pelvis: Secondary | ICD-10-CM

## 2023-02-20 DIAGNOSIS — Z5111 Encounter for antineoplastic chemotherapy: Secondary | ICD-10-CM | POA: Diagnosis present

## 2023-02-20 LAB — CBC WITH DIFFERENTIAL/PLATELET
Abs Immature Granulocytes: 0.02 10*3/uL (ref 0.00–0.07)
Basophils Absolute: 0.1 10*3/uL (ref 0.0–0.1)
Basophils Relative: 1 %
Eosinophils Absolute: 0.2 10*3/uL (ref 0.0–0.5)
Eosinophils Relative: 3 %
HCT: 37.4 % — ABNORMAL LOW (ref 39.0–52.0)
Hemoglobin: 12.9 g/dL — ABNORMAL LOW (ref 13.0–17.0)
Immature Granulocytes: 0 %
Lymphocytes Relative: 28 %
Lymphs Abs: 2 10*3/uL (ref 0.7–4.0)
MCH: 31.8 pg (ref 26.0–34.0)
MCHC: 34.5 g/dL (ref 30.0–36.0)
MCV: 92.1 fL (ref 80.0–100.0)
Monocytes Absolute: 0.6 10*3/uL (ref 0.1–1.0)
Monocytes Relative: 8 %
Neutro Abs: 4.3 10*3/uL (ref 1.7–7.7)
Neutrophils Relative %: 60 %
Platelets: 192 10*3/uL (ref 150–400)
RBC: 4.06 MIL/uL — ABNORMAL LOW (ref 4.22–5.81)
RDW: 17.5 % — ABNORMAL HIGH (ref 11.5–15.5)
WBC: 7.2 10*3/uL (ref 4.0–10.5)
nRBC: 0 % (ref 0.0–0.2)

## 2023-02-20 LAB — COMPREHENSIVE METABOLIC PANEL
ALT: 37 U/L (ref 0–44)
AST: 32 U/L (ref 15–41)
Albumin: 3.7 g/dL (ref 3.5–5.0)
Alkaline Phosphatase: 78 U/L (ref 38–126)
Anion gap: 8 (ref 5–15)
BUN: 20 mg/dL (ref 6–20)
CO2: 26 mmol/L (ref 22–32)
Calcium: 9 mg/dL (ref 8.9–10.3)
Chloride: 102 mmol/L (ref 98–111)
Creatinine, Ser: 0.87 mg/dL (ref 0.61–1.24)
GFR, Estimated: >60 mL/min (ref >60)
Glucose, Bld: 93 mg/dL (ref 70–99)
Potassium: 3.7 mmol/L (ref 3.5–5.1)
Sodium: 136 mmol/L (ref 135–145)
Total Bilirubin: 0.6 mg/dL (ref <1.2)
Total Protein: 7.2 g/dL (ref 6.5–8.1)

## 2023-02-20 LAB — TSH: TSH: 9.422 u[IU]/mL — ABNORMAL HIGH (ref 0.350–4.500)

## 2023-02-20 LAB — VITAMIN D 25 HYDROXY (VIT D DEFICIENCY, FRACTURES): Vit D, 25-Hydroxy: 34.42 ng/mL (ref 30–100)

## 2023-02-20 LAB — MAGNESIUM: Magnesium: 1.8 mg/dL (ref 1.7–2.4)

## 2023-02-20 MED ORDER — SODIUM CHLORIDE 0.9 % IV SOLN: Freq: Once | INTRAVENOUS | Status: AC

## 2023-02-20 MED ORDER — SODIUM CHLORIDE 0.9 % IV SOLN
480.0000 mg | Freq: Once | INTRAVENOUS | Status: AC
  Administered 2023-02-20: 480 mg via INTRAVENOUS
  Filled 2023-02-20: qty 48

## 2023-02-20 NOTE — Progress Notes (Signed)
Labs reviewed today. No issues reported by patient. Will proceed as planned.  Treatment given per orders. Patient tolerated it well without problems. Vitals stable and discharged home from clinic ambulatory. Follow up as scheduled.

## 2023-02-20 NOTE — Patient Instructions (Signed)
[facility] - A DEPT OF [facility]  Discharge Instructions: Thank you for choosing [facility] to provide your oncology and hematology care.  If you have a lab appointment with the Cancer Center - please note that after April 8th, 2024, all labs will be drawn in the cancer center.  You do not have to check in or register with the main entrance as you have in the past but will complete your check-in in the cancer center.  Wear comfortable clothing and clothing appropriate for easy access to any Portacath or PICC line.   We strive to give you quality time with your provider. You may need to reschedule your appointment if you arrive late (15 or more minutes).  Arriving late affects you and other patients whose appointments are after yours.  Also, if you miss three or more appointments without notifying the office, you may be dismissed from the clinic at the provider's discretion.      For prescription refill requests, have your pharmacy contact our office and allow 72 hours for refills to be completed.    Today you received the following chemotherapy and/or immunotherapy agents Nivolumab     To help prevent nausea and vomiting after your treatment, we encourage you to take your nausea medication as directed.  BELOW ARE SYMPTOMS THAT SHOULD BE REPORTED IMMEDIATELY: *FEVER GREATER THAN 100.4 F (38 C) OR HIGHER *CHILLS OR SWEATING *NAUSEA AND VOMITING THAT IS NOT CONTROLLED WITH YOUR NAUSEA MEDICATION *UNUSUAL SHORTNESS OF BREATH *UNUSUAL BRUISING OR BLEEDING *URINARY PROBLEMS (pain or burning when urinating, or frequent urination) *BOWEL PROBLEMS (unusual diarrhea, constipation, pain near the anus) TENDERNESS IN MOUTH AND THROAT WITH OR WITHOUT PRESENCE OF ULCERS (sore throat, sores in mouth, or a toothache) UNUSUAL RASH, SWELLING OR PAIN  UNUSUAL VAGINAL DISCHARGE OR ITCHING   Items with * indicate a potential emergency and should be followed up  as soon as possible or go to the Emergency Department if any problems should occur.  Please show the CHEMOTHERAPY ALERT CARD or IMMUNOTHERAPY ALERT CARD at check-in to the Emergency Department and triage nurse.  Should you have questions after your visit or need to cancel or reschedule your appointment, please contact [facility] - A DEPT OF [facility] [phone]  and follow the prompts.  Office hours are 8:00 a.m. to 4:30 p.m. Monday - Friday. Please note that voicemails left after 4:00 p.m. may not be returned until the following business day.  We are closed weekends and major holidays. You have access to a nurse at all times for urgent questions. Please call the main number to the clinic [phone] and follow the prompts.  For any non-urgent questions, you may also contact your provider using MyChart. We now offer e-Visits for anyone 59 and older to request care online for non-urgent symptoms. For details visit [email or web address].   Also download the MyChart app! Go to the app store, search "MyChart", open the app, select [facility], and log in with your MyChart username and password.

## 2023-02-21 ENCOUNTER — Encounter: Payer: Self-pay | Admitting: Urology

## 2023-02-21 ENCOUNTER — Ambulatory Visit (INDEPENDENT_AMBULATORY_CARE_PROVIDER_SITE_OTHER): Payer: 59 | Admitting: Urology

## 2023-02-21 VITALS — BP 130/85 | HR 69

## 2023-02-21 DIAGNOSIS — C641 Malignant neoplasm of right kidney, except renal pelvis: Secondary | ICD-10-CM | POA: Diagnosis not present

## 2023-02-21 DIAGNOSIS — N5089 Other specified disorders of the male genital organs: Secondary | ICD-10-CM

## 2023-02-21 LAB — MICROSCOPIC EXAMINATION
Bacteria, UA: NONE SEEN
RBC, Urine: NONE SEEN /[HPF] (ref 0–2)

## 2023-02-21 LAB — URINALYSIS, ROUTINE W REFLEX MICROSCOPIC
Bilirubin, UA: NEGATIVE
Glucose, UA: NEGATIVE
Ketones, UA: NEGATIVE
Nitrite, UA: NEGATIVE
Protein,UA: NEGATIVE
RBC, UA: NEGATIVE
Specific Gravity, UA: 1.01 (ref 1.005–1.030)
Urobilinogen, Ur: 2 mg/dL — ABNORMAL HIGH (ref 0.2–1.0)
pH, UA: 8 — ABNORMAL HIGH (ref 5.0–7.5)

## 2023-02-21 LAB — T4: T4, Total: 7.5 ug/dL (ref 4.5–12.0)

## 2023-02-21 MED ORDER — DOXYCYCLINE HYCLATE 100 MG PO CAPS: 100.0000 mg | ORAL_CAPSULE | Freq: Two times a day (BID) | ORAL | 0 refills | Status: DC
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
## 2023-02-22 ENCOUNTER — Other Ambulatory Visit: Payer: Self-pay

## 2023-02-26 ENCOUNTER — Ambulatory Visit (HOSPITAL_COMMUNITY)
Admission: RE | Admit: 2023-02-26 | Discharge: 2023-02-26 | Disposition: A | Discharge status: Home / Self Care | Payer: 59 | Source: Ambulatory Visit | Attending: Urology | Admitting: Urology

## 2023-02-26 DIAGNOSIS — N5089 Other specified disorders of the male genital organs: Secondary | ICD-10-CM | POA: Diagnosis present

## 2023-02-27 ENCOUNTER — Telehealth: Payer: Self-pay | Admitting: Urology

## 2023-02-27 ENCOUNTER — Other Ambulatory Visit: Payer: Self-pay

## 2023-02-27 NOTE — Telephone Encounter (Signed)
See patient concern below and please advise.

## 2023-02-27 NOTE — Patient Instructions (Signed)
Orchitis  Orchitis is inflammation of a testicle. Testicles are the male organs that produce sperm. The testicles are held in a fleshy sac (scrotum) located behind the penis. Orchitis usually affects only one testicle, but it can affect both. Orchitis is caused by infection. Many kinds of bacteria and viruses can cause this infection. The condition can develop suddenly. What are the causes? This condition may be caused by: Infection from viruses or bacteria. Other organisms, such as fungi or parasites. This is rare but can happen in men who have a weak body defense system (immune system), such as men who have HIV. Bacteria  Bacterial orchitis often occurs along with an infection of the tube that collects and stores sperm (epididymis). In men who are not sexually active, this infection usually starts as a urinary tract infection and spreads to the testicle. In sexually active men, sexually transmitted infections (STIs) are the most common cause of bacterial orchitis. These can include: Gonorrhea. Chlamydia. Viruses Mumps is the most common cause of viral orchitis, though mumps is now rare in many areas because of vaccination. Other viruses that can cause orchitis include: The chickenpox virus (varicella-zoster virus). The virus that causes mononucleosis (Epstein-Barr virus). What increases the risk? The following factors may make you more likely to develop this condition: For viral orchitis: Not having been vaccinated against mumps. For bacterial orchitis: Having had frequent urinary tract infections. Engaging in high-risk sexual behaviors, such as having multiple sexual partners or having sex without using a condom. Having a sexual partner with an STI. Having had urinary tract surgery. Using a tube that is passed through the penis to drain urine (Foley catheter). Having an enlarged prostate gland. What are the signs or symptoms? The most common symptoms of orchitis are swelling and  pain in the scrotum. Other signs and symptoms may include: Feeling generally sick (malaise). Fever and chills. Painful urination. Painful ejaculation. Headache. Fatigue. Nausea. Blood or discharge from the penis. Swollen lymph nodes in the groin area (inguinal nodes). How is this diagnosed? This condition may be diagnosed based on: Your symptoms. Your health care provider may suspect orchitis if you have a painful, swollen testicle along with other signs and symptoms of the condition. A physical exam. You may also have other tests, including: A blood test to check for signs of infection. A urine test to check for a urinary tract infection or STI. Using a swab to collect a fluid sample from the tip of the penis to test for STIs. Taking an image of the testicle using sound waves and a computer (testicular ultrasound). How is this treated? Treatment for this condition depends on the cause.  For bacterial orchitis, your health care provider may prescribe antibiotic medicines. Bacterial infections usually clear up within a few days. For both viral infections and bacterial infections, treatment may include: Rest. Anti-inflammatory medicines. Pain medicines. Raising (elevating) the scrotum with a towel or pillow and applying ice. Follow these instructions at home: Managing pain and swelling Elevate your scrotum and apply ice as directed. To do this: Put ice in a plastic bag. Place a small towel or pillow between your legs. Rest your scrotum on the pillow or towel. Place another towel between your skin and the plastic bag. Leave the ice on for 20 minutes, 2-3 times a day. Remove the ice if your skin turns bright red. This is very important. If you cannot feel pain, heat, or cold, you have a greater risk of damage to the area. General instructions  Rest as told by your health care provider. Take over-the-counter and prescription medicines only as told by your health care provider. If you  were prescribed an antibiotic medicine, take it as told by your health care provider. Do not stop taking the antibiotic even if you start to feel better. Do not have sex until your health care provider says it is okay to do so. Keep all follow-up visits. This is important. Contact a health care provider if: You have a fever. Pain and swelling have not gotten better after 3 days. Get help right away if: Your pain is getting worse. The swelling in your testicle gets worse. Summary Orchitis is inflammation of a testicle. It is caused by an infection from bacteria or a virus. The most common symptoms of orchitis are swelling and pain in the scrotum. Treatment for this condition depends on the cause. It may include medicines to fight the infection, reduce inflammation, and relieve the pain. Follow your health care provider's instructions about resting, icing, not having sex, and taking medicines. This information is not intended to replace advice given to you by your health care provider. Make sure you discuss any questions you have with your health care provider. Document Revised: 09/28/2020 Document Reviewed: 09/28/2020 Elsevier Patient Education  2024 [employer].
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
## 2023-02-28 ENCOUNTER — Other Ambulatory Visit: Payer: Self-pay | Admitting: Urology

## 2023-02-28 ENCOUNTER — Encounter: Payer: Self-pay | Admitting: Urology

## 2023-02-28 DIAGNOSIS — N5089 Other specified disorders of the male genital organs: Secondary | ICD-10-CM

## 2023-02-28 NOTE — Telephone Encounter (Signed)
Verbal from Dr. Ronne Binning testis looks infarcted on the [place]. I will order testis tumor labs to ensure this is not cancer.  Patient notified via response to mychart message along with scheduled lab appt.

## 2023-02-28 NOTE — Telephone Encounter (Signed)
See below

## 2023-03-05 ENCOUNTER — Other Ambulatory Visit: Payer: Self-pay | Admitting: Hematology

## 2023-03-05 ENCOUNTER — Telehealth: Payer: Self-pay | Admitting: Urology

## 2023-03-05 ENCOUNTER — Other Ambulatory Visit (HOSPITAL_COMMUNITY): Payer: Self-pay

## 2023-03-05 MED ORDER — HYDROCODONE-ACETAMINOPHEN 10-325 MG PO TABS
1.0000 | ORAL_TABLET | Freq: Four times a day (QID) | ORAL | 0 refills | Status: DC | PRN
  Filled 2023-03-05: qty 90, 23d supply, fill #0

## 2023-03-05 NOTE — Telephone Encounter (Signed)
Pt would like a refill of hydrocodone.

## 2023-03-06 NOTE — Telephone Encounter (Signed)
Looks like Dr. Ellin Saba refilled rx on 12/02

## 2023-03-07 ENCOUNTER — Other Ambulatory Visit (HOSPITAL_COMMUNITY): Payer: Self-pay

## 2023-03-07 ENCOUNTER — Other Ambulatory Visit: Payer: Self-pay | Admitting: *Deleted

## 2023-03-07 MED ORDER — HYDROCODONE-ACETAMINOPHEN 10-325 MG PO TABS: 1.0000 | ORAL_TABLET | Freq: Four times a day (QID) | ORAL | 0 refills | Status: DC | PRN

## 2023-03-08 ENCOUNTER — Other Ambulatory Visit: Payer: Self-pay

## 2023-03-11 ENCOUNTER — Other Ambulatory Visit: Payer: Self-pay | Admitting: Hematology

## 2023-03-12 ENCOUNTER — Encounter: Payer: Self-pay | Admitting: Hematology

## 2023-03-14 ENCOUNTER — Other Ambulatory Visit: Payer: 59

## 2023-03-15 LAB — AFP TUMOR MARKER: AFP, Serum, Tumor Marker: <1.8 ng/mL (ref 0.0–8.4)

## 2023-03-15 LAB — LACTATE DEHYDROGENASE: LDH: 214 [IU]/L (ref 121–224)

## 2023-03-15 LAB — BETA HCG QUANT (REF LAB): hCG Quant: <1 m[IU]/mL (ref 0–3)

## 2023-03-19 ENCOUNTER — Inpatient Hospital Stay: Payer: 59

## 2023-03-19 ENCOUNTER — Inpatient Hospital Stay: Payer: 59 | Admitting: Hematology

## 2023-03-20 ENCOUNTER — Other Ambulatory Visit: Payer: Self-pay | Admitting: Urology

## 2023-03-20 DIAGNOSIS — N5089 Other specified disorders of the male genital organs: Secondary | ICD-10-CM

## 2023-03-29 ENCOUNTER — Inpatient Hospital Stay: Payer: 59 | Admitting: Hematology

## 2023-03-29 ENCOUNTER — Other Ambulatory Visit: Payer: Self-pay

## 2023-03-29 ENCOUNTER — Inpatient Hospital Stay: Payer: 59 | Attending: Physician Assistant

## 2023-03-29 DIAGNOSIS — Z5111 Encounter for antineoplastic chemotherapy: Secondary | ICD-10-CM | POA: Insufficient documentation

## 2023-03-29 DIAGNOSIS — N4400 Torsion of testis, unspecified: Secondary | ICD-10-CM | POA: Insufficient documentation

## 2023-03-29 DIAGNOSIS — I10 Essential (primary) hypertension: Secondary | ICD-10-CM | POA: Insufficient documentation

## 2023-03-29 DIAGNOSIS — C641 Malignant neoplasm of right kidney, except renal pelvis: Secondary | ICD-10-CM | POA: Diagnosis not present

## 2023-03-29 DIAGNOSIS — Z79899 Other long term (current) drug therapy: Secondary | ICD-10-CM | POA: Insufficient documentation

## 2023-03-29 DIAGNOSIS — C7951 Secondary malignant neoplasm of bone: Secondary | ICD-10-CM | POA: Diagnosis not present

## 2023-03-29 LAB — COMPREHENSIVE METABOLIC PANEL
ALT: 32 U/L (ref 0–44)
AST: 35 U/L (ref 15–41)
Albumin: 3.9 g/dL (ref 3.5–5.0)
Alkaline Phosphatase: 76 U/L (ref 38–126)
Anion gap: 7 (ref 5–15)
BUN: 18 mg/dL (ref 6–20)
CO2: 26 mmol/L (ref 22–32)
Calcium: 9.1 mg/dL (ref 8.9–10.3)
Chloride: 104 mmol/L (ref 98–111)
Creatinine, Ser: 0.9 mg/dL (ref 0.61–1.24)
GFR, Estimated: >60 mL/min (ref >60)
Glucose, Bld: 125 mg/dL — ABNORMAL HIGH (ref 70–99)
Potassium: 4.1 mmol/L (ref 3.5–5.1)
Sodium: 137 mmol/L (ref 135–145)
Total Bilirubin: 0.7 mg/dL (ref <1.2)
Total Protein: 7.3 g/dL (ref 6.5–8.1)

## 2023-03-29 LAB — CBC WITH DIFFERENTIAL/PLATELET
Abs Immature Granulocytes: 0 10*3/uL (ref 0.00–0.07)
Basophils Absolute: 0 10*3/uL (ref 0.0–0.1)
Basophils Relative: 0 %
Eosinophils Absolute: 1.6 10*3/uL — ABNORMAL HIGH (ref 0.0–0.5)
Eosinophils Relative: 13 %
HCT: 36.6 % — ABNORMAL LOW (ref 39.0–52.0)
Hemoglobin: 12.5 g/dL — ABNORMAL LOW (ref 13.0–17.0)
Lymphocytes Relative: 17 %
Lymphs Abs: 2.1 10*3/uL (ref 0.7–4.0)
MCH: 33.8 pg (ref 26.0–34.0)
MCHC: 34.2 g/dL (ref 30.0–36.0)
MCV: 98.9 fL (ref 80.0–100.0)
Monocytes Absolute: 0.5 10*3/uL (ref 0.1–1.0)
Monocytes Relative: 4 %
Neutro Abs: 8.3 10*3/uL — ABNORMAL HIGH (ref 1.7–7.7)
Neutrophils Relative %: 66 %
Platelets: 191 10*3/uL (ref 150–400)
RBC: 3.7 MIL/uL — ABNORMAL LOW (ref 4.22–5.81)
RDW: 16.3 % — ABNORMAL HIGH (ref 11.5–15.5)
WBC: 12.5 10*3/uL — ABNORMAL HIGH (ref 4.0–10.5)
nRBC: 0 % (ref 0.0–0.2)

## 2023-03-29 LAB — MAGNESIUM: Magnesium: 2 mg/dL (ref 1.7–2.4)

## 2023-03-29 MED ORDER — CABOZANTINIB S-MALATE 40 MG PO TABS: 40.0000 mg | ORAL_TABLET | Freq: Every day | ORAL | 2 refills | Status: DC

## 2023-03-29 NOTE — Patient Instructions (Addendum)
[facility] Discharge Instructions   You were seen and examined today by Dr. Ellin Saba.  He reviewed the results of your lab work which are normal/stable.   We will proceed with your treatment tomorrow.   Continue Cabometyx as prescribed. Stop this one week before surgery and two weeks after.   Return as scheduled.    Thank you for choosing [facility] at [facility] to provide your oncology and hematology care.  To afford each patient quality time with our provider, please arrive at least 15 minutes before your scheduled appointment time.   If you have a lab appointment with the Cancer Center please come in thru the Main Entrance and check in at the main information desk.  You need to re-schedule your appointment should you arrive 10 or more minutes late.  We strive to give you quality time with our providers, and arriving late affects you and other patients whose appointments are after yours.  Also, if you no show three or more times for appointments you may be dismissed from the clinic at the providers discretion.     Again, thank you for choosing [facility].  Our hope is that these requests will decrease the amount of time that you wait before being seen by our physicians.       _____________________________________________________________  Should you have questions after your visit to [facility], please contact our office at [phone] and follow the prompts.  Our office hours are 8:00 a.m. and 4:30 p.m. Monday - Friday.  Please note that voicemails left after 4:00 p.m. may not be returned until the following business day.  We are closed weekends and major holidays.  You do have access to a nurse 24-7, just call the main number to the clinic [phone] and do not press any options, hold on the line and a nurse will answer the phone.    For prescription refill requests, have your pharmacy  contact our office and allow 72 hours.    Due to Covid, you will need to wear a mask upon entering the hospital. If you do not have a mask, a mask will be given to you at the Main Entrance upon arrival. For doctor visits, patients may have 1 support person age 60 or older with them. For treatment visits, patients can not have anyone with them due to social distancing guidelines and our immunocompromised population.

## 2023-03-29 NOTE — Progress Notes (Signed)
Patient is taking Cabometyx as prescribed.  He has not missed any doses and reports no side effects at this time.
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
## 2023-03-30 ENCOUNTER — Inpatient Hospital Stay: Payer: 59

## 2023-03-30 ENCOUNTER — Other Ambulatory Visit: Payer: Self-pay

## 2023-03-30 VITALS — BP 114/66 | HR 56 | Temp 98.0°F | Resp 18 | Wt 216.1 lb

## 2023-03-30 DIAGNOSIS — Z5111 Encounter for antineoplastic chemotherapy: Secondary | ICD-10-CM | POA: Diagnosis not present

## 2023-03-30 DIAGNOSIS — C641 Malignant neoplasm of right kidney, except renal pelvis: Secondary | ICD-10-CM

## 2023-03-30 MED ORDER — SODIUM CHLORIDE 0.9 % IV SOLN: Freq: Once | INTRAVENOUS | Status: AC

## 2023-03-30 MED ORDER — NIVOLUMAB CHEMO INJECTION 100 MG/10ML
480.0000 mg | Freq: Once | INTRAVENOUS | Status: AC
  Administered 2023-03-30: 480 mg via INTRAVENOUS
  Filled 2023-03-30: qty 48

## 2023-03-30 NOTE — Progress Notes (Signed)
Patient presents today for Opdivo infusion per provider order.  Vital signs and labs reviewed by MD on 03/29/23.  Message received from Chapman Moss RN/Dr. Ellin Saba patient okay for treatment on 03/30/23.    Peripheral IV started and blood return noted pre and post infusion.  Treatment given today per MD orders.  Stable during infusion without adverse affects.  Vital signs stable.  No complaints at this time.  Discharge from clinic ambulatory in stable condition.  Alert and oriented X 3.  Follow up with [facility] as scheduled.

## 2023-03-30 NOTE — Patient Instructions (Signed)
 [facility] [place] - A DEPT OF [facility]  Discharge Instructions: Thank you for choosing [facility] to provide your oncology and hematology care.  If you have a lab appointment with the Cancer Center - please note that after April 8th, 2024, all labs will be drawn in the cancer center.  You do not have to check in or register with the main entrance as you have in the past but will complete your check-in in the cancer center.  Wear comfortable clothing and clothing appropriate for easy access to any Portacath or PICC line.   We strive to give you quality time with your provider. You may need to reschedule your appointment if you arrive late (15 or more minutes).  Arriving late affects you and other patients whose appointments are after yours.  Also, if you miss three or more appointments without notifying the office, you may be dismissed from the clinic at the provider's discretion.      For prescription refill requests, have your pharmacy contact our office and allow 72 hours for refills to be completed.    Today you received the following chemotherapy and/or immunotherapy agents Opdivo.       To help prevent nausea and vomiting after your treatment, we encourage you to take your nausea medication as directed.  BELOW ARE SYMPTOMS THAT SHOULD BE REPORTED IMMEDIATELY: *FEVER GREATER THAN 100.4 F (38 C) OR HIGHER *CHILLS OR SWEATING *NAUSEA AND VOMITING THAT IS NOT CONTROLLED WITH YOUR NAUSEA MEDICATION *UNUSUAL SHORTNESS OF BREATH *UNUSUAL BRUISING OR BLEEDING *URINARY PROBLEMS (pain or burning when urinating, or frequent urination) *BOWEL PROBLEMS (unusual diarrhea, constipation, pain near the anus) TENDERNESS IN MOUTH AND THROAT WITH OR WITHOUT PRESENCE OF ULCERS (sore throat, sores in mouth, or a toothache) UNUSUAL RASH, SWELLING OR PAIN  UNUSUAL VAGINAL DISCHARGE OR ITCHING   Items with * indicate a potential emergency and should be followed up  as soon as possible or go to the Emergency Department if any problems should occur.  Please show the CHEMOTHERAPY ALERT CARD or IMMUNOTHERAPY ALERT CARD at check-in to the Emergency Department and triage nurse.  Should you have questions after your visit or need to cancel or reschedule your appointment, please contact [facility] [place] - A DEPT OF [facility] [phone]  and follow the prompts.  Office hours are 8:00 a.m. to 4:30 p.m. Monday - Friday. Please note that voicemails left after 4:00 p.m. may not be returned until the following business day.  We are closed weekends and major holidays. You have access to a nurse at all times for urgent questions. Please call the main number to the clinic [phone] and follow the prompts.  For any non-urgent questions, you may also contact your provider using MyChart. We now offer e-Visits for anyone 58 and older to request care online for non-urgent symptoms. For details visit [email or web address].   Also download the MyChart app! Go to the app store, search "MyChart", open the app, select , and log in with your MyChart username and password.

## 2023-04-01 ENCOUNTER — Other Ambulatory Visit: Payer: Self-pay

## 2023-04-02 ENCOUNTER — Other Ambulatory Visit: Payer: 59

## 2023-04-06 ENCOUNTER — Ambulatory Visit: Payer: 59 | Admitting: Urology

## 2023-04-06 VITALS — BP 122/76 | HR 64

## 2023-04-06 DIAGNOSIS — N5089 Other specified disorders of the male genital organs: Secondary | ICD-10-CM | POA: Diagnosis not present

## 2023-04-06 LAB — URINALYSIS, ROUTINE W REFLEX MICROSCOPIC
Bilirubin, UA: NEGATIVE
Glucose, UA: NEGATIVE
Ketones, UA: NEGATIVE
Nitrite, UA: NEGATIVE
Protein,UA: NEGATIVE
Specific Gravity, UA: 1.015 (ref 1.005–1.030)
Urobilinogen, Ur: 1 mg/dL (ref 0.2–1.0)
pH, UA: 6.5 (ref 5.0–7.5)

## 2023-04-06 LAB — MICROSCOPIC EXAMINATION: Bacteria, UA: NONE SEEN
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
## 2023-04-08 ENCOUNTER — Other Ambulatory Visit: Payer: Self-pay

## 2023-04-15 ENCOUNTER — Encounter: Payer: Self-pay | Admitting: Urology

## 2023-04-15 NOTE — Patient Instructions (Signed)
 Testicular Torsion, Adult  Testicular torsion is a twisting of the spermatic cord, artery, and vein that go to the testicle. This twisting cuts off the blood supply to the testicle. Testicular torsion requires emergency treatment. The testicle can usually be saved if the torsion is treated within 4-6 hours from the time the twisting started. If the torsion is left untreated for too long, the testicle will be damaged beyond repair and will have to be removed. What are the causes? The most common cause of this condition is an abnormality in which the tissue that connects the testicle to the scrotum is missing (bell clapper deformity). This abnormality allows the testicle to rotate and the spermatic cord to get twisted. Other possible causes of testicular torsion include: A tumor or mass in the testicle. An unusually long spermatic cord. This condition may also be caused by an injury, vigorous physical activity, or it may happen spontaneously. What increases the risk? This condition is more likely to develop in: Newborns. Adolescents. Men who have experienced testicular torsion in the past. Men who have a family history of testicular torsion. What are the signs or symptoms? The main symptom of testicular torsion is severe pain in the testicle. Other symptoms may include: Swelling, redness, tenderness, or hardening of the scrotum. Pain that spreads to the abdomen. One testicle that appears to be larger than the other. The testicle being higher than normal in the scrotum. Nausea or vomiting. How is this diagnosed? This condition is diagnosed with a physical exam and medical history. You may have tests, including: Ultrasound. X-ray. MRI. Urine tests. How is this treated? This condition is treated with surgery. Surgery should be done as soon as possible after torsion occurs. During surgery, the testicle is untwisted and evaluated. If the torsion is severe or if a lot of time has passed since  the torsion started, the testicle may be removed. In some cases, your health care provider may first untwist your testicle by hand (manually) if your testicle is still mobile and if this does not cause too much pain. Surgery is still needed after this is done. After the testicle is untwisted, stitches (sutures) will be sewn in to secure the testicles and prevent future torsion. Summary Testicular torsion is a twisting of the spermatic cord, artery, and vein that go to the testicle. The most common symptom of this condition is severe pain in the testicle. Testicular torsion requires emergency treatment. If the torsion is left untreated for too long, the testicle will have to be removed. Surgery should be done as soon as possible after torsion occurs. During surgery, the testicle is untwisted and evaluated. This information is not intended to replace advice given to you by your health care provider. Make sure you discuss any questions you have with your health care provider. Document Revised: 06/28/2020 Document Reviewed: 06/28/2020 Elsevier Patient Education  2024 [employer].

## 2023-04-16 ENCOUNTER — Inpatient Hospital Stay: Payer: 59

## 2023-04-16 ENCOUNTER — Inpatient Hospital Stay: Payer: 59 | Admitting: Hematology

## 2023-04-24 ENCOUNTER — Other Ambulatory Visit: Payer: Self-pay

## 2023-04-25 ENCOUNTER — Other Ambulatory Visit: Payer: Self-pay | Admitting: *Deleted

## 2023-04-25 ENCOUNTER — Other Ambulatory Visit (HOSPITAL_COMMUNITY): Payer: Self-pay

## 2023-04-25 MED ORDER — HYDROCODONE-ACETAMINOPHEN 10-325 MG PO TABS
1.0000 | ORAL_TABLET | Freq: Four times a day (QID) | ORAL | 0 refills | Status: DC | PRN
  Filled 2023-04-25: qty 90, 23d supply, fill #0

## 2023-04-26 ENCOUNTER — Other Ambulatory Visit (HOSPITAL_COMMUNITY): Payer: Self-pay

## 2023-04-30 ENCOUNTER — Inpatient Hospital Stay: Payer: 59 | Attending: Physician Assistant

## 2023-04-30 ENCOUNTER — Inpatient Hospital Stay: Payer: 59

## 2023-04-30 ENCOUNTER — Inpatient Hospital Stay: Payer: 59 | Admitting: Hematology

## 2023-04-30 VITALS — BP 115/74 | HR 57 | Temp 97.7°F | Resp 18 | Ht 72.0 in | Wt 215.4 lb

## 2023-04-30 DIAGNOSIS — Z5111 Encounter for antineoplastic chemotherapy: Secondary | ICD-10-CM | POA: Insufficient documentation

## 2023-04-30 DIAGNOSIS — C641 Malignant neoplasm of right kidney, except renal pelvis: Secondary | ICD-10-CM | POA: Diagnosis present

## 2023-04-30 DIAGNOSIS — Z79899 Other long term (current) drug therapy: Secondary | ICD-10-CM | POA: Insufficient documentation

## 2023-04-30 LAB — COMPREHENSIVE METABOLIC PANEL
ALT: 35 U/L (ref 0–44)
AST: 35 U/L (ref 15–41)
Albumin: 3.8 g/dL (ref 3.5–5.0)
Alkaline Phosphatase: 89 U/L (ref 38–126)
Anion gap: 8 (ref 5–15)
BUN: 19 mg/dL (ref 6–20)
CO2: 28 mmol/L (ref 22–32)
Calcium: 9.4 mg/dL (ref 8.9–10.3)
Chloride: 102 mmol/L (ref 98–111)
Creatinine, Ser: 1.13 mg/dL (ref 0.61–1.24)
GFR, Estimated: >60 mL/min (ref >60)
Glucose, Bld: 87 mg/dL (ref 70–99)
Potassium: 4.1 mmol/L (ref 3.5–5.1)
Sodium: 138 mmol/L (ref 135–145)
Total Bilirubin: 0.6 mg/dL (ref 0.0–1.2)
Total Protein: 7.6 g/dL (ref 6.5–8.1)

## 2023-04-30 LAB — CBC WITH DIFFERENTIAL/PLATELET
Abs Immature Granulocytes: 0.03 10*3/uL (ref 0.00–0.07)
Basophils Absolute: 0.1 10*3/uL (ref 0.0–0.1)
Basophils Relative: 1 %
Eosinophils Absolute: 1.2 10*3/uL — ABNORMAL HIGH (ref 0.0–0.5)
Eosinophils Relative: 9 %
HCT: 34.9 % — ABNORMAL LOW (ref 39.0–52.0)
Hemoglobin: 11.8 g/dL — ABNORMAL LOW (ref 13.0–17.0)
Immature Granulocytes: 0 %
Lymphocytes Relative: 13 %
Lymphs Abs: 1.7 10*3/uL (ref 0.7–4.0)
MCH: 35.2 pg — ABNORMAL HIGH (ref 26.0–34.0)
MCHC: 33.8 g/dL (ref 30.0–36.0)
MCV: 104.2 fL — ABNORMAL HIGH (ref 80.0–100.0)
Monocytes Absolute: 0.7 10*3/uL (ref 0.1–1.0)
Monocytes Relative: 5 %
Neutro Abs: 9.3 10*3/uL — ABNORMAL HIGH (ref 1.7–7.7)
Neutrophils Relative %: 72 %
Platelets: 217 10*3/uL (ref 150–400)
RBC: 3.35 MIL/uL — ABNORMAL LOW (ref 4.22–5.81)
RDW: 15.8 % — ABNORMAL HIGH (ref 11.5–15.5)
WBC: 13 10*3/uL — ABNORMAL HIGH (ref 4.0–10.5)
nRBC: 0 % (ref 0.0–0.2)

## 2023-04-30 LAB — TSH: TSH: 17.716 u[IU]/mL — ABNORMAL HIGH (ref 0.350–4.500)

## 2023-04-30 LAB — MAGNESIUM: Magnesium: 1.8 mg/dL (ref 1.7–2.4)

## 2023-04-30 MED ORDER — SODIUM CHLORIDE 0.9 % IV SOLN
480.0000 mg | Freq: Once | INTRAVENOUS | Status: AC
  Administered 2023-04-30: 480 mg via INTRAVENOUS
  Filled 2023-04-30: qty 48

## 2023-04-30 MED ORDER — SODIUM CHLORIDE 0.9 % IV SOLN: Freq: Once | INTRAVENOUS | Status: AC

## 2023-04-30 NOTE — Progress Notes (Signed)
Patient presents today for Opdivo infusion. Patient is in satisfactory condition with no new complaints voiced.  Vital signs are stable.  Labs reviewed and all labs are within treatment parameters.  We will proceed with treatment per MD orders.    Treatment given today per MD orders. Tolerated infusion without adverse affects. Vital signs stable. No complaints at this time. Discharged from clinic ambulatory in stable condition. Alert and oriented x 3. F/U with [facility] as scheduled.

## 2023-04-30 NOTE — Patient Instructions (Signed)
[facility] - A DEPT OF [facility]  Discharge Instructions: Thank you for choosing [facility] to provide your oncology and hematology care.  If you have a lab appointment with the Cancer Center - please note that after April 8th, 2024, all labs will be drawn in the cancer center.  You do not have to check in or register with the main entrance as you have in the past but will complete your check-in in the cancer center.  Wear comfortable clothing and clothing appropriate for easy access to any Portacath or PICC line.   We strive to give you quality time with your provider. You may need to reschedule your appointment if you arrive late (15 or more minutes).  Arriving late affects you and other patients whose appointments are after yours.  Also, if you miss three or more appointments without notifying the office, you may be dismissed from the clinic at the provider's discretion.      For prescription refill requests, have your pharmacy contact our office and allow 72 hours for refills to be completed.    Today you received the following chemotherapy and/or immunotherapy agents Opdivo   To help prevent nausea and vomiting after your treatment, we encourage you to take your nausea medication as directed.  Nivolumab Injection What is this medication? NIVOLUMAB (nye VOL ue mab) treats some types of cancer. It works by helping your immune system slow or stop the spread of cancer cells. It is a monoclonal antibody. This medicine may be used for other purposes; ask your health care provider or pharmacist if you have questions. COMMON BRAND NAME(S): Opdivo What should I tell my care team before I take this medication? They need to know if you have any of these conditions: Allogeneic stem cell transplant (uses someone else's stem cells) Autoimmune diseases, such as Crohn disease, ulcerative colitis, lupus History of chest radiation Nervous system problems,  such as Guillain-Barre syndrome or myasthenia gravis Organ transplant An unusual or allergic reaction to nivolumab, other medications, foods, dyes, or preservatives Pregnant or trying to get pregnant Breast-feeding How should I use this medication? This medication is infused into a vein. It is given in a hospital or clinic setting. A special MedGuide will be given to you before each treatment. Be sure to read this information carefully each time. Talk to your care team about the use of this medication in children. While it may be prescribed for children as young as 12 years for selected conditions, precautions do apply. Overdosage: If you think you have taken too much of this medicine contact a poison control center or emergency room at once. NOTE: This medicine is only for you. Do not share this medicine with others. What if I miss a dose? Keep appointments for follow-up doses. It is important not to miss your dose. Call your care team if you are unable to keep an appointment. What may interact with this medication? Interactions have not been studied. This list may not describe all possible interactions. Give your health care provider a list of all the medicines, herbs, non-prescription drugs, or dietary supplements you use. Also tell them if you smoke, drink alcohol, or use illegal drugs. Some items may interact with your medicine. What should I watch for while using this medication? Your condition will be monitored carefully while you are receiving this medication. You may need blood work while taking this medication. This medication may cause serious skin reactions. They  can happen weeks to months after starting the medication. Contact your care team right away if you notice fevers or flu-like symptoms with a rash. The rash may be red or purple and then turn into blisters or peeling of the skin. You may also notice a red rash with swelling of the face, lips, or lymph nodes in your neck or under  your arms. Tell your care team right away if you have any change in your eyesight. Talk to your care team if you are pregnant or think you might be pregnant. A negative pregnancy test is required before starting this medication. A reliable form of contraception is recommended while taking this medication and for 5 months after the last dose. Talk to your care team about effective forms of contraception. Do not breast-feed while taking this medication and for 5 months after the last dose. What side effects may I notice from receiving this medication? Side effects that you should report to your care team as soon as possible: Allergic reactions--skin rash, itching, hives, swelling of the face, lips, tongue, or throat Dry cough, shortness of breath or trouble breathing Eye pain, redness, irritation, or discharge with blurry or decreased vision Heart muscle inflammation--unusual weakness or fatigue, shortness of breath, chest pain, fast or irregular heartbeat, dizziness, swelling of the ankles, feet, or hands Hormone gland problems--headache, sensitivity to light, unusual weakness or fatigue, dizziness, fast or irregular heartbeat, increased sensitivity to cold or heat, excessive sweating, constipation, hair loss, increased thirst or amount of urine, tremors or shaking, irritability Infusion reactions--chest pain, shortness of breath or trouble breathing, feeling faint or lightheaded Kidney injury (glomerulonephritis)--decrease in the amount of urine, red or dark brown urine, foamy or bubbly urine, swelling of the ankles, hands, or feet Liver injury--right upper belly pain, loss of appetite, nausea, light-colored stool, dark yellow or brown urine, yellowing skin or eyes, unusual weakness or fatigue Pain, tingling, or numbness in the hands or feet, muscle weakness, change in vision, confusion or trouble speaking, loss of balance or coordination, trouble walking, seizures Rash, fever, and swollen lymph  nodes Redness, blistering, peeling, or loosening of the skin, including inside the mouth Sudden or severe stomach pain, bloody diarrhea, fever, nausea, vomiting Side effects that usually do not require medical attention (report these to your care team if they continue or are bothersome): Bone, joint, or muscle pain Diarrhea Fatigue Loss of appetite Nausea Skin rash This list may not describe all possible side effects. Call your doctor for medical advice about side effects. You may report side effects to FDA at 1-800-FDA-1088. Where should I keep my medication? This medication is given in a hospital or clinic. It will not be stored at home. NOTE: This sheet is a summary. It may not cover all possible information. If you have questions about this medicine, talk to your doctor, pharmacist, or health care provider.  2024 Elsevier/Gold Standard (2021-07-18 00:00:00)   BELOW ARE SYMPTOMS THAT SHOULD BE REPORTED IMMEDIATELY: *FEVER GREATER THAN 100.4 F (38 C) OR HIGHER *CHILLS OR SWEATING *NAUSEA AND VOMITING THAT IS NOT CONTROLLED WITH YOUR NAUSEA MEDICATION *UNUSUAL SHORTNESS OF BREATH *UNUSUAL BRUISING OR BLEEDING *URINARY PROBLEMS (pain or burning when urinating, or frequent urination) *BOWEL PROBLEMS (unusual diarrhea, constipation, pain near the anus) TENDERNESS IN MOUTH AND THROAT WITH OR WITHOUT PRESENCE OF ULCERS (sore throat, sores in mouth, or a toothache) UNUSUAL RASH, SWELLING OR PAIN  UNUSUAL VAGINAL DISCHARGE OR ITCHING   Items with * indicate a potential emergency and should be  followed up as soon as possible or go to the Emergency Department if any problems should occur.  Please show the CHEMOTHERAPY ALERT CARD or IMMUNOTHERAPY ALERT CARD at check-in to the Emergency Department and triage nurse.  Should you have questions after your visit or need to cancel or reschedule your appointment, please contact [facility] [place] - A DEPT OF [facility]  [phone]  and follow the prompts.  Office hours are 8:00 a.m. to 4:30 p.m. Monday - Friday. Please note that voicemails left after 4:00 p.m. may not be returned until the following business day.  We are closed weekends and major holidays. You have access to a nurse at all times for urgent questions. Please call the main number to the clinic [phone] and follow the prompts.  For any non-urgent questions, you may also contact your provider using MyChart. We now offer e-Visits for anyone 11 and older to request care online for non-urgent symptoms. For details visit [email or web address].   Also download the MyChart app! Go to the app store, search "MyChart", open the app, select [place], and log in with your MyChart username and password.

## 2023-05-01 LAB — T4: T4, Total: 7.6 ug/dL (ref 4.5–12.0)

## 2023-05-17 ENCOUNTER — Other Ambulatory Visit: Payer: Self-pay | Admitting: *Deleted

## 2023-05-17 ENCOUNTER — Inpatient Hospital Stay
Admission: RE | Admit: 2023-05-17 | Discharge: 2023-05-17 | Disposition: A | Discharge status: Home / Self Care | Payer: Self-pay | Source: Ambulatory Visit | Attending: Hematology | Admitting: Hematology

## 2023-05-17 ENCOUNTER — Other Ambulatory Visit: Payer: Self-pay | Admitting: Hematology

## 2023-05-17 DIAGNOSIS — C641 Malignant neoplasm of right kidney, except renal pelvis: Secondary | ICD-10-CM

## 2023-05-17 DIAGNOSIS — R9089 Other abnormal findings on diagnostic imaging of central nervous system: Secondary | ICD-10-CM

## 2023-05-17 DIAGNOSIS — Z85850 Personal history of malignant neoplasm of thyroid: Secondary | ICD-10-CM

## 2023-05-17 NOTE — Telephone Encounter (Signed)
Please review

## 2023-05-21 ENCOUNTER — Ambulatory Visit (HOSPITAL_COMMUNITY): Payer: 59

## 2023-05-22 ENCOUNTER — Other Ambulatory Visit (HOSPITAL_COMMUNITY): Payer: 59

## 2023-05-24 ENCOUNTER — Ambulatory Visit (HOSPITAL_COMMUNITY)
Admission: RE | Admit: 2023-05-24 | Discharge: 2023-05-24 | Disposition: A | Discharge status: Home / Self Care | Payer: 59 | Source: Ambulatory Visit | Attending: Hematology | Admitting: Hematology

## 2023-05-24 DIAGNOSIS — Z85850 Personal history of malignant neoplasm of thyroid: Secondary | ICD-10-CM | POA: Insufficient documentation

## 2023-05-24 DIAGNOSIS — R9089 Other abnormal findings on diagnostic imaging of central nervous system: Secondary | ICD-10-CM | POA: Diagnosis present

## 2023-05-24 DIAGNOSIS — C641 Malignant neoplasm of right kidney, except renal pelvis: Secondary | ICD-10-CM | POA: Diagnosis present

## 2023-05-24 MED ORDER — GADOBUTROL 1 MMOL/ML IV SOLN
10.0000 mL | Freq: Once | INTRAVENOUS | Status: AC | PRN
  Administered 2023-05-24: 10 mL via INTRAVENOUS

## 2023-05-28 ENCOUNTER — Inpatient Hospital Stay: Payer: 59 | Attending: Physician Assistant

## 2023-05-28 ENCOUNTER — Other Ambulatory Visit: Payer: Self-pay

## 2023-05-28 ENCOUNTER — Encounter (HOSPITAL_COMMUNITY): Payer: Self-pay

## 2023-05-28 ENCOUNTER — Inpatient Hospital Stay: Payer: 59

## 2023-05-28 ENCOUNTER — Inpatient Hospital Stay: Payer: 59 | Admitting: Hematology

## 2023-05-28 VITALS — BP 111/75 | HR 58 | Temp 97.7°F | Resp 18 | Wt 212.9 lb

## 2023-05-28 DIAGNOSIS — C649 Malignant neoplasm of unspecified kidney, except renal pelvis: Secondary | ICD-10-CM | POA: Diagnosis not present

## 2023-05-28 DIAGNOSIS — C7951 Secondary malignant neoplasm of bone: Secondary | ICD-10-CM | POA: Insufficient documentation

## 2023-05-28 DIAGNOSIS — Z79899 Other long term (current) drug therapy: Secondary | ICD-10-CM | POA: Diagnosis not present

## 2023-05-28 DIAGNOSIS — I10 Essential (primary) hypertension: Secondary | ICD-10-CM | POA: Insufficient documentation

## 2023-05-28 DIAGNOSIS — C641 Malignant neoplasm of right kidney, except renal pelvis: Secondary | ICD-10-CM | POA: Diagnosis present

## 2023-05-28 DIAGNOSIS — C7931 Secondary malignant neoplasm of brain: Secondary | ICD-10-CM | POA: Insufficient documentation

## 2023-05-28 LAB — CBC WITH DIFFERENTIAL/PLATELET
Abs Immature Granulocytes: 0.07 10*3/uL (ref 0.00–0.07)
Basophils Absolute: 0.1 10*3/uL (ref 0.0–0.1)
Basophils Relative: 1 %
Eosinophils Absolute: 0.5 10*3/uL (ref 0.0–0.5)
Eosinophils Relative: 3 %
HCT: 33.8 % — ABNORMAL LOW (ref 39.0–52.0)
Hemoglobin: 11.1 g/dL — ABNORMAL LOW (ref 13.0–17.0)
Immature Granulocytes: 1 %
Lymphocytes Relative: 9 %
Lymphs Abs: 1.4 10*3/uL (ref 0.7–4.0)
MCH: 34.5 pg — ABNORMAL HIGH (ref 26.0–34.0)
MCHC: 32.8 g/dL (ref 30.0–36.0)
MCV: 105 fL — ABNORMAL HIGH (ref 80.0–100.0)
Monocytes Absolute: 0.8 10*3/uL (ref 0.1–1.0)
Monocytes Relative: 5 %
Neutro Abs: 12.6 10*3/uL — ABNORMAL HIGH (ref 1.7–7.7)
Neutrophils Relative %: 81 %
Platelets: 241 10*3/uL (ref 150–400)
RBC: 3.22 MIL/uL — ABNORMAL LOW (ref 4.22–5.81)
RDW: 14 % (ref 11.5–15.5)
WBC: 15.4 10*3/uL — ABNORMAL HIGH (ref 4.0–10.5)
nRBC: 0 % (ref 0.0–0.2)

## 2023-05-28 LAB — COMPREHENSIVE METABOLIC PANEL
ALT: 29 U/L (ref 0–44)
AST: 28 U/L (ref 15–41)
Albumin: 3.4 g/dL — ABNORMAL LOW (ref 3.5–5.0)
Alkaline Phosphatase: 88 U/L (ref 38–126)
Anion gap: 8 (ref 5–15)
BUN: 22 mg/dL — ABNORMAL HIGH (ref 6–20)
CO2: 26 mmol/L (ref 22–32)
Calcium: 8.9 mg/dL (ref 8.9–10.3)
Chloride: 100 mmol/L (ref 98–111)
Creatinine, Ser: 1.13 mg/dL (ref 0.61–1.24)
GFR, Estimated: >60 mL/min (ref >60)
Glucose, Bld: 91 mg/dL (ref 70–99)
Potassium: 3.5 mmol/L (ref 3.5–5.1)
Sodium: 134 mmol/L — ABNORMAL LOW (ref 135–145)
Total Bilirubin: 0.5 mg/dL (ref 0.0–1.2)
Total Protein: 7.4 g/dL (ref 6.5–8.1)

## 2023-05-28 LAB — MAGNESIUM: Magnesium: 1.8 mg/dL (ref 1.7–2.4)

## 2023-05-28 LAB — TSH: TSH: 12.596 u[IU]/mL — ABNORMAL HIGH (ref 0.350–4.500)

## 2023-05-28 MED ORDER — DEXAMETHASONE 4 MG PO TABS: 4.0000 mg | ORAL_TABLET | Freq: Every day | ORAL | 1 refills | Status: DC

## 2023-05-28 MED ORDER — LENVATINIB (18 MG DAILY DOSE) 10 MG & 2 X 4 MG PO CPPK: 18.0000 mg | ORAL_CAPSULE | Freq: Every day | ORAL | 1 refills | Status: DC

## 2023-05-28 MED ORDER — EVEROLIMUS 5 MG PO TABS
5.0000 mg | ORAL_TABLET | Freq: Every day | ORAL | 1 refills | Status: DC
  Filled 2023-05-28: qty 30, 30d supply, fill #0

## 2023-05-28 NOTE — Progress Notes (Signed)
 Per MD to hold treatment today. Treatment team made aware.   Rolf Fells [employer]

## 2023-05-28 NOTE — Patient Instructions (Addendum)
 [facility] at [facility] Discharge Instructions   You were seen and examined today by Dr. Ellin Saba.  He reviewed the results of your lab work which are normal/stable.   We will hold your treatment today due to progression of disease. Stop taking the Cabometyx also.   You can stop the Keppra. Take one pill every other day for 7 days and then stop.   You may follow up with the radiation doctor at [facility] as scheduled.   Return as scheduled.    Thank you for choosing [facility] at [facility] to provide your oncology and hematology care.  To afford each patient quality time with our provider, please arrive at least 15 minutes before your scheduled appointment time.   If you have a lab appointment with the Cancer Center please come in thru the Main Entrance and check in at the main information desk.  You need to re-schedule your appointment should you arrive 10 or more minutes late.  We strive to give you quality time with our providers, and arriving late affects you and other patients whose appointments are after yours.  Also, if you no show three or more times for appointments you may be dismissed from the clinic at the providers discretion.     Again, thank you for choosing [facility].  Our hope is that these requests will decrease the amount of time that you wait before being seen by our physicians.       _____________________________________________________________  Should you have questions after your visit to [facility], please contact our office at [phone] and follow the prompts.  Our office hours are 8:00 a.m. and 4:30 p.m. Monday - Friday.  Please note that voicemails left after 4:00 p.m. may not be returned until the following business day.  We are closed weekends and major holidays.  You do have access to a nurse 24-7, just call the main number to the clinic [phone] and do not press any options,  hold on the line and a nurse will answer the phone.    For prescription refill requests, have your pharmacy contact our office and allow 72 hours.    Due to Covid, you will need to wear a mask upon entering the hospital. If you do not have a mask, a mask will be given to you at the Main Entrance upon arrival. For doctor visits, patients may have 1 support person age 38 or older with them. For treatment visits, patients can not have anyone with them due to social distancing guidelines and our immunocompromised population.
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
## 2023-05-29 ENCOUNTER — Other Ambulatory Visit: Payer: Self-pay

## 2023-05-29 ENCOUNTER — Encounter: Payer: Self-pay | Admitting: Hematology

## 2023-05-29 ENCOUNTER — Other Ambulatory Visit (HOSPITAL_COMMUNITY): Payer: Self-pay

## 2023-05-29 ENCOUNTER — Telehealth: Payer: Self-pay | Admitting: Pharmacy Technician

## 2023-05-29 ENCOUNTER — Telehealth: Payer: Self-pay

## 2023-05-29 ENCOUNTER — Other Ambulatory Visit: Payer: Self-pay | Admitting: *Deleted

## 2023-05-29 DIAGNOSIS — C641 Malignant neoplasm of right kidney, except renal pelvis: Secondary | ICD-10-CM

## 2023-05-29 LAB — T4: T4, Total: 6.8 ug/dL (ref 4.5–12.0)

## 2023-05-29 MED ORDER — DEXAMETHASONE 0.5 MG/5ML PO SOLN: ORAL | 3 refills | Status: DC

## 2023-05-29 MED ORDER — EVEROLIMUS 5 MG PO TABS: 5.0000 mg | ORAL_TABLET | Freq: Every day | ORAL | 1 refills | Status: DC

## 2023-05-29 MED ORDER — HYDROCODONE-ACETAMINOPHEN 10-325 MG PO TABS: 1.0000 | ORAL_TABLET | Freq: Three times a day (TID) | ORAL | 0 refills | Status: DC | PRN

## 2023-05-29 NOTE — Telephone Encounter (Signed)
 Oral Oncology Patient Advocate Encounter  Prior Authorization for everolimus has been approved.    PA#  [id number] Effective dates: 05/29/2023 through 05/28/2024  Patient must fill at [facility] specialty.   [name], CPhT Oncology Pharmacy Patient Advocate [facility] Direct Number: [phone] Fax: [phone]

## 2023-05-29 NOTE — Telephone Encounter (Addendum)
 Oral Oncology Patient Advocate Encounter  Prior Authorization for lenvatinib has been approved.    PA# [id number] Effective dates: 05/29/2023 through 05/28/2024  Patient must fill at [facility] specialty.   [name], CPhT Oncology Pharmacy Patient Advocate [facility] Direct Number: [phone] Fax: [phone]

## 2023-05-29 NOTE — Telephone Encounter (Signed)
 Oral Oncology Patient Advocate Encounter   Received notification that prior authorization for everolimus is required.   PA submitted on 05/29/2023 Key [id number] Status is pending     [name], CPhT Oncology Pharmacy Patient Advocate [facility] Direct Number: [phone] Fax: [phone]

## 2023-05-29 NOTE — Progress Notes (Signed)
 Due to insurance restriction the medication could not be filled at [facility]. Prescription has been e-scribed to [facility].

## 2023-05-29 NOTE — Telephone Encounter (Addendum)
 Oral Chemotherapy Pharmacist Encounter   Received new prescription for Lenvima (lenvatinib) & Afinitor (everolimus) for the treatment of metastatic papillary renal cell carcinoma (non-clear), planned duration until disease progression or unacceptable drug toxicity.  CBC/CMP from 05/28/23 assessed, no baseline dose adjustments required. Prescription dose and frequency assessed.   Current medication list in Epic reviewed, no significant DDIs with lenvatinib or everolimus identified.   Evaluated chart and no patient barriers to medication adherence identified.   Prescription has been e-scribed to the [facility] for benefits analysis and approval.  Oral Oncology Clinic will continue to follow for insurance authorization, copayment issues, initial counseling and start date.   Jerry Caras, PharmD PGY2 Oncology Pharmacy Resident   05/29/2023 9:26 AM

## 2023-05-29 NOTE — Telephone Encounter (Signed)
 Oral Oncology Patient Advocate Encounter   Received notification that prior authorization for lenvatinib is required.   PA submitted on 05/29/2023 Key: [id number]  Status is pending     [name], CPhT Oncology Pharmacy Patient Advocate [facility] Direct Number: [phone] Fax: [phone]

## 2023-05-29 NOTE — Telephone Encounter (Signed)
 Everolimus and lenvatinib both redirected to [facility] per insurance requirement.

## 2023-05-30 ENCOUNTER — Encounter: Payer: Self-pay | Admitting: Hematology

## 2023-05-30 NOTE — Telephone Encounter (Signed)
 Per [facility] [employer] rep, medications set for delivery today 05/30/23. Copays $0

## 2023-05-30 NOTE — Progress Notes (Signed)
 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   Patient presented to the ER with complaints of numbness in the left upper extremity, left chest wall and left hemiface.  CT brain was performed and was suggestive of brain metastasis.  MRI Brain 05/24/2023: 2.8 cm right parietal superior sensory strip mass, 2.7 cm left occipital mass, subcentimeter additional medial right occipital metastasis and abnormal enhancement in the right internal auditory canal. Mild vasogenic edema with the 2 dominant lesions.     Past or anticipated interventions, if any, per neurosurgery:    Past or anticipated interventions, if any, per medical oncology:  Dr. Ellin Saba 05/28/2023 - MRI brain on 05/24/2023: 2.8 cm right parietal superior sensory strip mass, 2.7 cm left occipital mass, subcentimeter additional medial right occipital metastasis and abnormal enhancement in the right internal auditory canal.  Mild vasogenic edema with the 2 dominant lesions. - He was started on Keppra in [place] ER.  He did not have any seizure.  I have told him to taper off Keppra.  We will start dexamethasone 4 mg daily in the morning until he starts radiation.  He has consultation with radiation oncology on Thursday.    Dose of Decadron, if applicable: 4 mg Daily  Recent neurologic symptoms, if any:  Seizures: No Headaches: No Nausea: No Dizziness/ataxia: No Difficulty with hand coordination: No Focal numbness/weakness: He feels like his left arm is weighted, has some tingling in his right heel.  He reports sensitivity in the \\left  shoulder down into his upper arm. Visual deficits/changes: No Confusion/Memory deficits: No    SAFETY ISSUES: Prior radiation? No Pacemaker/ICD? No Possible current pregnancy? No Is the patient on methotrexate? No  Additional Complaints / other details:  History of Thyroid cancer- thyroidectomy

## 2023-05-30 NOTE — Telephone Encounter (Signed)
 Oral Chemotherapy Pharmacist Encounter   Patient Education I spoke with patient for overview of new oral chemotherapy medication: Lenvima (lenvatinib) & Afinitor (everolimus) for the treatment of metastatic papillary renal cell carcinoma (non-clear), planned duration until disease progression or unacceptable drug toxicity.   Plan to start medications on 05/31/23 after patient receives delivery. Patient expresses understanding to this plan.    Counseled patient on administration, dosing, side effects, monitoring, drug-food interactions, safe handling, storage, and disposal. Lenvatinib: patient will take one 10mg  capsule and two 4mg  capsules (18 mg total) by mouth daily . Everolimus: patient will take 1 tablet (5 mg total) by mouth daily  Side effects include but not limited to the following: Lenvatinib: hypertension, diarrhea, hand-foot syndrome, headache, and fatigue Everolimus: mucositis, myelosuppression, electrolyte abnormalities, fluid retention, and cough    Patient was prescribed dexamethasone 0.5 mg/ml solution to be use for mucositis prevention in the setting of everolimus therapy. He will swish 10 ml of the solution by mouth for two minutes before spitting four times a day. Patient made aware that prescription was sent to [facility].   Reviewed with patient importance of keeping a medication schedule and plan for any missed doses.  After discussion with patient no patient barriers to medication adherence identified.   He voiced understanding and appreciation. All questions answered. Medication handout provided.  Provided patient with Oral Chemotherapy Navigation Clinic phone number. Patient knows to call the office with questions or concerns. Oral Chemotherapy Navigation Clinic will continue to follow.   Jerry Caras, PharmD PGY2 Oncology Pharmacy Resident   05/30/2023 11:06 AM

## 2023-05-31 ENCOUNTER — Ambulatory Visit
Admission: RE | Admit: 2023-05-31 | Discharge: 2023-05-31 | Disposition: A | Discharge status: Home / Self Care | Payer: 59 | Source: Ambulatory Visit | Attending: Radiation Oncology | Admitting: Radiation Oncology

## 2023-05-31 ENCOUNTER — Other Ambulatory Visit: Payer: Self-pay | Admitting: Hematology

## 2023-05-31 ENCOUNTER — Encounter: Payer: Self-pay | Admitting: Radiation Oncology

## 2023-05-31 ENCOUNTER — Other Ambulatory Visit: Payer: Self-pay | Admitting: Radiation Therapy

## 2023-05-31 VITALS — BP 113/75 | HR 65 | Temp 97.8°F | Resp 18 | Ht 72.0 in | Wt 216.0 lb

## 2023-05-31 DIAGNOSIS — C641 Malignant neoplasm of right kidney, except renal pelvis: Secondary | ICD-10-CM | POA: Insufficient documentation

## 2023-05-31 DIAGNOSIS — M25512 Pain in left shoulder: Secondary | ICD-10-CM | POA: Insufficient documentation

## 2023-05-31 DIAGNOSIS — I10 Essential (primary) hypertension: Secondary | ICD-10-CM | POA: Insufficient documentation

## 2023-05-31 DIAGNOSIS — R590 Localized enlarged lymph nodes: Secondary | ICD-10-CM | POA: Insufficient documentation

## 2023-05-31 DIAGNOSIS — Z7952 Long term (current) use of systemic steroids: Secondary | ICD-10-CM | POA: Diagnosis not present

## 2023-05-31 DIAGNOSIS — C7931 Secondary malignant neoplasm of brain: Secondary | ICD-10-CM

## 2023-05-31 DIAGNOSIS — E079 Disorder of thyroid, unspecified: Secondary | ICD-10-CM | POA: Diagnosis not present

## 2023-05-31 DIAGNOSIS — Z79890 Hormone replacement therapy (postmenopausal): Secondary | ICD-10-CM | POA: Insufficient documentation

## 2023-05-31 DIAGNOSIS — C649 Malignant neoplasm of unspecified kidney, except renal pelvis: Secondary | ICD-10-CM

## 2023-05-31 DIAGNOSIS — Z801 Family history of malignant neoplasm of trachea, bronchus and lung: Secondary | ICD-10-CM | POA: Diagnosis not present

## 2023-05-31 DIAGNOSIS — E785 Hyperlipidemia, unspecified: Secondary | ICD-10-CM | POA: Diagnosis not present

## 2023-05-31 DIAGNOSIS — R200 Anesthesia of skin: Secondary | ICD-10-CM | POA: Insufficient documentation

## 2023-05-31 DIAGNOSIS — Z800 Family history of malignant neoplasm of digestive organs: Secondary | ICD-10-CM | POA: Diagnosis not present

## 2023-05-31 DIAGNOSIS — Z79899 Other long term (current) drug therapy: Secondary | ICD-10-CM | POA: Insufficient documentation

## 2023-05-31 DIAGNOSIS — Z85850 Personal history of malignant neoplasm of thyroid: Secondary | ICD-10-CM | POA: Diagnosis not present
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
## 2023-06-02 ENCOUNTER — Ambulatory Visit (HOSPITAL_COMMUNITY)
Admission: RE | Admit: 2023-06-02 | Discharge: 2023-06-02 | Disposition: A | Discharge status: Home / Self Care | Payer: 59 | Source: Ambulatory Visit | Attending: Radiation Oncology | Admitting: Radiation Oncology

## 2023-06-02 DIAGNOSIS — C7931 Secondary malignant neoplasm of brain: Secondary | ICD-10-CM | POA: Diagnosis present

## 2023-06-02 MED ORDER — GADOBUTROL 1 MMOL/ML IV SOLN
10.0000 mL | Freq: Once | INTRAVENOUS | Status: AC | PRN
  Administered 2023-06-02: 10 mL via INTRAVENOUS

## 2023-06-04 ENCOUNTER — Ambulatory Visit
Admission: RE | Admit: 2023-06-04 | Discharge: 2023-06-04 | Disposition: A | Discharge status: Home / Self Care | Payer: 59 | Source: Ambulatory Visit | Attending: Radiation Oncology | Admitting: Radiation Oncology

## 2023-06-04 ENCOUNTER — Telehealth: Payer: Self-pay | Admitting: Radiation Oncology

## 2023-06-04 ENCOUNTER — Other Ambulatory Visit: Payer: Self-pay | Admitting: Radiation Therapy

## 2023-06-04 ENCOUNTER — Ambulatory Visit (HOSPITAL_COMMUNITY)
Admission: RE | Admit: 2023-06-04 | Discharge: 2023-06-04 | Disposition: A | Discharge status: Home / Self Care | Source: Ambulatory Visit | Attending: Radiation Oncology | Admitting: Radiation Oncology

## 2023-06-04 ENCOUNTER — Inpatient Hospital Stay: Payer: 59 | Attending: Physician Assistant

## 2023-06-04 ENCOUNTER — Ambulatory Visit
Admission: RE | Admit: 2023-06-04 | Discharge: 2023-06-04 | Disposition: A | Discharge status: Home / Self Care | Payer: 59 | Source: Ambulatory Visit | Attending: Radiation Oncology

## 2023-06-04 VITALS — BP 142/88 | HR 63 | Temp 97.6°F | Resp 18 | Ht 72.0 in | Wt 216.6 lb

## 2023-06-04 DIAGNOSIS — Z85528 Personal history of other malignant neoplasm of kidney: Secondary | ICD-10-CM | POA: Insufficient documentation

## 2023-06-04 DIAGNOSIS — D649 Anemia, unspecified: Secondary | ICD-10-CM | POA: Insufficient documentation

## 2023-06-04 DIAGNOSIS — Z9884 Bariatric surgery status: Secondary | ICD-10-CM | POA: Insufficient documentation

## 2023-06-04 DIAGNOSIS — Z79899 Other long term (current) drug therapy: Secondary | ICD-10-CM | POA: Insufficient documentation

## 2023-06-04 DIAGNOSIS — C7801 Secondary malignant neoplasm of right lung: Secondary | ICD-10-CM | POA: Insufficient documentation

## 2023-06-04 DIAGNOSIS — Z85850 Personal history of malignant neoplasm of thyroid: Secondary | ICD-10-CM | POA: Insufficient documentation

## 2023-06-04 DIAGNOSIS — Z510 Encounter for antineoplastic radiation therapy: Secondary | ICD-10-CM | POA: Diagnosis present

## 2023-06-04 DIAGNOSIS — C641 Malignant neoplasm of right kidney, except renal pelvis: Secondary | ICD-10-CM | POA: Insufficient documentation

## 2023-06-04 DIAGNOSIS — C7931 Secondary malignant neoplasm of brain: Secondary | ICD-10-CM

## 2023-06-04 DIAGNOSIS — I10 Essential (primary) hypertension: Secondary | ICD-10-CM | POA: Insufficient documentation

## 2023-06-04 DIAGNOSIS — C7802 Secondary malignant neoplasm of left lung: Secondary | ICD-10-CM | POA: Insufficient documentation

## 2023-06-04 MED ORDER — SODIUM CHLORIDE 0.9% FLUSH
10.0000 mL | Freq: Once | INTRAVENOUS | Status: AC
  Administered 2023-06-04: 10 mL via INTRAVENOUS

## 2023-06-04 MED ORDER — GADOBUTROL 1 MMOL/ML IV SOLN
10.0000 mL | Freq: Once | INTRAVENOUS | Status: AC | PRN
  Administered 2023-06-04: 10 mL via INTRAVENOUS

## 2023-06-04 NOTE — Telephone Encounter (Signed)
 I spoke with the patient about his MRI results and we will follow up with the spine MRI results as well.

## 2023-06-04 NOTE — Progress Notes (Addendum)
 Has armband been applied?  Yes.    Does patient have an allergy to IV contrast dye?: No.   Has patient ever received premedication for IV contrast dye?: No.   Does patient take metformin?: No.  If patient does take metformin when was the last dose: May 28, 2023  Date of lab work: 05/28/2023 BUN: 22 CR: 1.13 eGfr: >60  IV site: antecubital left, condition patent and no redness  Has IV site been added to flowsheet?  Yes.    IV to left antecubital to be left in place due to patient having MRI with contrast at [facility] today at 4pm.  BP (!) 142/88 (BP Location: Right Arm, Patient Position: Sitting, Cuff Size: Large)   Pulse 63   Temp 97.6 F (36.4 C)   Resp 18   Ht 6' (1.829 m)   Wt 216 lb 9.6 oz (98.2 kg)   SpO2 100%   BMI 29.38 kg/m

## 2023-06-05 ENCOUNTER — Other Ambulatory Visit: Payer: Self-pay

## 2023-06-05 ENCOUNTER — Telehealth: Payer: Self-pay

## 2023-06-05 NOTE — Telephone Encounter (Signed)
 Patient's wife called to reschedule upcoming appointment due to recent cancer diagnosis and treatment. Next available appointment is June.  Wanting to check if patient will need to move forward with having kidney removed?  Please advise.

## 2023-06-05 NOTE — Telephone Encounter (Signed)
 I called but had to leave a voicemail for the patient to return my call to review his MRI scan of the spine.

## 2023-06-06 ENCOUNTER — Telehealth: Payer: Self-pay | Admitting: Radiation Oncology

## 2023-06-06 ENCOUNTER — Ambulatory Visit
Admission: RE | Admit: 2023-06-06 | Discharge: 2023-06-06 | Disposition: A | Discharge status: Home / Self Care | Source: Ambulatory Visit | Attending: Radiation Oncology | Admitting: Radiation Oncology

## 2023-06-06 DIAGNOSIS — Z510 Encounter for antineoplastic radiation therapy: Secondary | ICD-10-CM | POA: Diagnosis not present

## 2023-06-06 NOTE — Telephone Encounter (Signed)
 Attempted to call this am to review MRI results. Pt unavailable.

## 2023-06-06 NOTE — Telephone Encounter (Signed)
 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 We discussed the rationale for whole brain radiation as well as palliative radiation to the L1-S1 cord. We discussed the course of 10 fractions of therapy to begin tomorrow, and the side effects both short and long term. He is in agreement to proceed and will come for simulation today. He is aware he does not need to see Dr. Maisie Fus in Neurosurgery today and we will cancel that appointment. He has also delayed his consul with Dr. Angelyn Punt.

## 2023-06-07 ENCOUNTER — Other Ambulatory Visit: Payer: Self-pay

## 2023-06-07 ENCOUNTER — Ambulatory Visit
Admission: RE | Admit: 2023-06-07 | Discharge: 2023-06-07 | Disposition: A | Discharge status: Home / Self Care | Source: Ambulatory Visit | Attending: Radiation Oncology | Admitting: Radiation Oncology

## 2023-06-07 DIAGNOSIS — Z510 Encounter for antineoplastic radiation therapy: Secondary | ICD-10-CM | POA: Diagnosis not present

## 2023-06-07 LAB — RAD ONC ARIA SESSION SUMMARY

## 2023-06-08 ENCOUNTER — Other Ambulatory Visit: Payer: Self-pay

## 2023-06-08 ENCOUNTER — Ambulatory Visit
Admission: RE | Admit: 2023-06-08 | Discharge: 2023-06-08 | Disposition: A | Discharge status: Home / Self Care | Source: Ambulatory Visit | Attending: Radiation Oncology | Admitting: Radiation Oncology

## 2023-06-08 ENCOUNTER — Ambulatory Visit: Admitting: Radiation Oncology

## 2023-06-08 ENCOUNTER — Ambulatory Visit: Payer: 59 | Admitting: Radiation Oncology

## 2023-06-08 ENCOUNTER — Ambulatory Visit: Payer: 59 | Admitting: Urology

## 2023-06-08 DIAGNOSIS — Z510 Encounter for antineoplastic radiation therapy: Secondary | ICD-10-CM | POA: Diagnosis not present

## 2023-06-08 LAB — RAD ONC ARIA SESSION SUMMARY

## 2023-06-11 ENCOUNTER — Other Ambulatory Visit: Payer: Self-pay

## 2023-06-11 ENCOUNTER — Ambulatory Visit
Admission: RE | Admit: 2023-06-11 | Discharge: 2023-06-11 | Disposition: A | Discharge status: Home / Self Care | Source: Ambulatory Visit | Attending: Radiation Oncology

## 2023-06-11 ENCOUNTER — Ambulatory Visit: Admitting: Radiation Oncology

## 2023-06-11 DIAGNOSIS — Z510 Encounter for antineoplastic radiation therapy: Secondary | ICD-10-CM | POA: Diagnosis not present

## 2023-06-11 LAB — RAD ONC ARIA SESSION SUMMARY
Course Elapsed Days: 4
Plan Fractions Treated to Date: 3
Plan Fractions Treated to Date: 3
Plan Prescribed Dose Per Fraction: 3 Gy
Plan Prescribed Dose Per Fraction: 3 Gy
Plan Total Fractions Prescribed: 10
Plan Total Fractions Prescribed: 10
Plan Total Prescribed Dose: 30 Gy
Plan Total Prescribed Dose: 30 Gy
Reference Point Dosage Given to Date: 9 Gy
Reference Point Dosage Given to Date: 9 Gy
Reference Point Session Dosage Given: 3 Gy
Reference Point Session Dosage Given: 3 Gy
Session Number: 3

## 2023-06-12 ENCOUNTER — Other Ambulatory Visit: Payer: Self-pay

## 2023-06-12 ENCOUNTER — Ambulatory Visit
Admission: RE | Admit: 2023-06-12 | Discharge: 2023-06-12 | Disposition: A | Discharge status: Home / Self Care | Source: Ambulatory Visit | Attending: Radiation Oncology | Admitting: Radiation Oncology

## 2023-06-12 DIAGNOSIS — Z510 Encounter for antineoplastic radiation therapy: Secondary | ICD-10-CM | POA: Diagnosis not present

## 2023-06-12 LAB — RAD ONC ARIA SESSION SUMMARY

## 2023-06-13 ENCOUNTER — Ambulatory Visit: Admitting: Radiation Oncology

## 2023-06-13 ENCOUNTER — Other Ambulatory Visit: Payer: Self-pay

## 2023-06-13 ENCOUNTER — Ambulatory Visit
Admission: RE | Admit: 2023-06-13 | Discharge: 2023-06-13 | Disposition: A | Discharge status: Home / Self Care | Source: Ambulatory Visit | Attending: Radiation Oncology | Admitting: Radiation Oncology

## 2023-06-13 DIAGNOSIS — Z510 Encounter for antineoplastic radiation therapy: Secondary | ICD-10-CM | POA: Diagnosis not present

## 2023-06-13 LAB — RAD ONC ARIA SESSION SUMMARY

## 2023-06-14 ENCOUNTER — Ambulatory Visit
Admission: RE | Admit: 2023-06-14 | Discharge: 2023-06-14 | Disposition: A | Discharge status: Home / Self Care | Source: Ambulatory Visit | Attending: Radiation Oncology | Admitting: Radiation Oncology

## 2023-06-14 ENCOUNTER — Other Ambulatory Visit: Payer: Self-pay

## 2023-06-14 DIAGNOSIS — Z510 Encounter for antineoplastic radiation therapy: Secondary | ICD-10-CM | POA: Diagnosis not present

## 2023-06-14 LAB — RAD ONC ARIA SESSION SUMMARY

## 2023-06-15 ENCOUNTER — Other Ambulatory Visit: Payer: Self-pay

## 2023-06-15 ENCOUNTER — Ambulatory Visit
Admission: RE | Admit: 2023-06-15 | Discharge: 2023-06-15 | Disposition: A | Discharge status: Home / Self Care | Source: Ambulatory Visit | Attending: Radiation Oncology | Admitting: Radiation Oncology

## 2023-06-15 ENCOUNTER — Other Ambulatory Visit (HOSPITAL_COMMUNITY): Payer: 59

## 2023-06-15 DIAGNOSIS — Z510 Encounter for antineoplastic radiation therapy: Secondary | ICD-10-CM | POA: Diagnosis not present

## 2023-06-15 LAB — RAD ONC ARIA SESSION SUMMARY

## 2023-06-18 ENCOUNTER — Ambulatory Visit: Payer: 59 | Admitting: Radiation Oncology

## 2023-06-18 ENCOUNTER — Other Ambulatory Visit: Payer: Self-pay

## 2023-06-18 ENCOUNTER — Ambulatory Visit: Payer: 59

## 2023-06-18 ENCOUNTER — Ambulatory Visit
Admission: RE | Admit: 2023-06-18 | Discharge: 2023-06-18 | Disposition: A | Discharge status: Home / Self Care | Source: Ambulatory Visit | Attending: Radiation Oncology

## 2023-06-18 DIAGNOSIS — Z85850 Personal history of malignant neoplasm of thyroid: Secondary | ICD-10-CM

## 2023-06-18 DIAGNOSIS — Z510 Encounter for antineoplastic radiation therapy: Secondary | ICD-10-CM | POA: Diagnosis not present

## 2023-06-18 DIAGNOSIS — C641 Malignant neoplasm of right kidney, except renal pelvis: Secondary | ICD-10-CM

## 2023-06-18 LAB — RAD ONC ARIA SESSION SUMMARY
Course Elapsed Days: 11
Plan Fractions Treated to Date: 8
Plan Fractions Treated to Date: 8
Plan Prescribed Dose Per Fraction: 3 Gy
Plan Prescribed Dose Per Fraction: 3 Gy
Plan Total Fractions Prescribed: 10
Plan Total Fractions Prescribed: 10
Plan Total Prescribed Dose: 30 Gy
Plan Total Prescribed Dose: 30 Gy
Reference Point Dosage Given to Date: 24 Gy
Reference Point Dosage Given to Date: 24 Gy
Reference Point Session Dosage Given: 3 Gy
Reference Point Session Dosage Given: 3 Gy
Session Number: 8

## 2023-06-19 ENCOUNTER — Other Ambulatory Visit: Payer: Self-pay

## 2023-06-19 ENCOUNTER — Ambulatory Visit
Admission: RE | Admit: 2023-06-19 | Discharge: 2023-06-19 | Disposition: A | Discharge status: Home / Self Care | Source: Ambulatory Visit | Attending: Radiation Oncology

## 2023-06-19 ENCOUNTER — Inpatient Hospital Stay: Payer: 59

## 2023-06-19 ENCOUNTER — Inpatient Hospital Stay (HOSPITAL_BASED_OUTPATIENT_CLINIC_OR_DEPARTMENT_OTHER): Payer: 59 | Admitting: Hematology

## 2023-06-19 VITALS — BP 121/76 | HR 65 | Temp 96.7°F | Resp 18 | Wt 216.9 lb

## 2023-06-19 DIAGNOSIS — Z79899 Other long term (current) drug therapy: Secondary | ICD-10-CM | POA: Diagnosis not present

## 2023-06-19 DIAGNOSIS — C641 Malignant neoplasm of right kidney, except renal pelvis: Secondary | ICD-10-CM

## 2023-06-19 DIAGNOSIS — Z85850 Personal history of malignant neoplasm of thyroid: Secondary | ICD-10-CM

## 2023-06-19 DIAGNOSIS — Z510 Encounter for antineoplastic radiation therapy: Secondary | ICD-10-CM | POA: Diagnosis not present

## 2023-06-19 LAB — CBC WITH DIFFERENTIAL/PLATELET
Abs Immature Granulocytes: 0.22 10*3/uL — ABNORMAL HIGH (ref 0.00–0.07)
Basophils Absolute: 0 10*3/uL (ref 0.0–0.1)
Basophils Relative: 0 %
Eosinophils Absolute: 0 10*3/uL (ref 0.0–0.5)
Eosinophils Relative: 0 %
HCT: 32 % — ABNORMAL LOW (ref 39.0–52.0)
Hemoglobin: 10.9 g/dL — ABNORMAL LOW (ref 13.0–17.0)
Immature Granulocytes: 1 %
Lymphocytes Relative: 1 %
Lymphs Abs: 0.2 10*3/uL — ABNORMAL LOW (ref 0.7–4.0)
MCH: 34.2 pg — ABNORMAL HIGH (ref 26.0–34.0)
MCHC: 34.1 g/dL (ref 30.0–36.0)
MCV: 100.3 fL — ABNORMAL HIGH (ref 80.0–100.0)
Monocytes Absolute: 0.2 10*3/uL (ref 0.1–1.0)
Monocytes Relative: 1 %
Neutro Abs: 15.5 10*3/uL — ABNORMAL HIGH (ref 1.7–7.7)
Neutrophils Relative %: 97 %
Platelets: 132 10*3/uL — ABNORMAL LOW (ref 150–400)
RBC: 3.19 MIL/uL — ABNORMAL LOW (ref 4.22–5.81)
RDW: 13.2 % (ref 11.5–15.5)
WBC: 16.2 10*3/uL — ABNORMAL HIGH (ref 4.0–10.5)
nRBC: 0 % (ref 0.0–0.2)

## 2023-06-19 LAB — COMPREHENSIVE METABOLIC PANEL
ALT: 31 U/L (ref 0–44)
AST: 22 U/L (ref 15–41)
Albumin: 3.2 g/dL — ABNORMAL LOW (ref 3.5–5.0)
Alkaline Phosphatase: 78 U/L (ref 38–126)
Anion gap: 12 (ref 5–15)
BUN: 37 mg/dL — ABNORMAL HIGH (ref 6–20)
CO2: 19 mmol/L — ABNORMAL LOW (ref 22–32)
Calcium: 8.9 mg/dL (ref 8.9–10.3)
Chloride: 102 mmol/L (ref 98–111)
Creatinine, Ser: 0.97 mg/dL (ref 0.61–1.24)
GFR, Estimated: >60 mL/min (ref >60)
Glucose, Bld: 132 mg/dL — ABNORMAL HIGH (ref 70–99)
Potassium: 4 mmol/L (ref 3.5–5.1)
Sodium: 133 mmol/L — ABNORMAL LOW (ref 135–145)
Total Bilirubin: 0.4 mg/dL (ref 0.0–1.2)
Total Protein: 6.8 g/dL (ref 6.5–8.1)

## 2023-06-19 LAB — RAD ONC ARIA SESSION SUMMARY

## 2023-06-19 LAB — TSH: TSH: 2.6 u[IU]/mL (ref 0.350–4.500)

## 2023-06-19 LAB — MAGNESIUM: Magnesium: 1.7 mg/dL (ref 1.7–2.4)

## 2023-06-19 NOTE — Patient Instructions (Addendum)
 [facility] at [facility] Discharge Instructions   You were seen and examined today by Dr. Ellin Saba.  He reviewed the results of your lab work which are normal/stable.   Continue Afinitor and Lenvima as prescribed.   We will see you back in 3 weeks. We will repeat lab work at that time.    Return as scheduled.    Thank you for choosing [facility] at [facility] to provide your oncology and hematology care.  To afford each patient quality time with our provider, please arrive at least 15 minutes before your scheduled appointment time.   If you have a lab appointment with the Cancer Center please come in thru the Main Entrance and check in at the main information desk.  You need to re-schedule your appointment should you arrive 10 or more minutes late.  We strive to give you quality time with our providers, and arriving late affects you and other patients whose appointments are after yours.  Also, if you no show three or more times for appointments you may be dismissed from the clinic at the providers discretion.     Again, thank you for choosing [facility].  Our hope is that these requests will decrease the amount of time that you wait before being seen by our physicians.       _____________________________________________________________  Should you have questions after your visit to [facility], please contact our office at [phone] and follow the prompts.  Our office hours are 8:00 a.m. and 4:30 p.m. Monday - Friday.  Please note that voicemails left after 4:00 p.m. may not be returned until the following business day.  We are closed weekends and major holidays.  You do have access to a nurse 24-7, just call the main number to the clinic [phone] and do not press any options, hold on the line and a nurse will answer the phone.    For prescription refill requests, have your pharmacy contact our office and  allow 72 hours.    Due to Covid, you will need to wear a mask upon entering the hospital. If you do not have a mask, a mask will be given to you at the Main Entrance upon arrival. For doctor visits, patients may have 1 support person age 70 or older with them. For treatment visits, patients can not have anyone with them due to social distancing guidelines and our immunocompromised population.

## 2023-06-19 NOTE — Progress Notes (Signed)
 Patient is taking Afinitor and Lenvima as prescribed. He has not missed any doses and reports no side effects at this time.
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
## 2023-06-20 ENCOUNTER — Ambulatory Visit
Admission: RE | Admit: 2023-06-20 | Discharge: 2023-06-20 | Disposition: A | Discharge status: Home / Self Care | Source: Ambulatory Visit | Attending: Radiation Oncology | Admitting: Radiation Oncology

## 2023-06-20 ENCOUNTER — Other Ambulatory Visit: Payer: Self-pay

## 2023-06-20 DIAGNOSIS — C649 Malignant neoplasm of unspecified kidney, except renal pelvis: Secondary | ICD-10-CM

## 2023-06-20 DIAGNOSIS — C7931 Secondary malignant neoplasm of brain: Secondary | ICD-10-CM

## 2023-06-20 DIAGNOSIS — Z510 Encounter for antineoplastic radiation therapy: Secondary | ICD-10-CM | POA: Diagnosis not present

## 2023-06-20 LAB — RAD ONC ARIA SESSION SUMMARY

## 2023-06-20 LAB — T4: T4, Total: 4.7 ug/dL (ref 4.5–12.0)

## 2023-06-21 NOTE — Radiation Completion Notes (Addendum)
  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   Intent: Palliative     ==========DELIVERED PLANS==========  First Treatment Date: 2023-06-07 Last Treatment Date: 2023-06-20   Plan Name: Brain_Whole Site: Brain Technique: Isodose Plan Mode: Photon Dose Per Fraction: 3 Gy Prescribed Dose (Delivered / Prescribed): 30 Gy / 30 Gy Prescribed Fxs (Delivered / Prescribed): 10 / 10   Plan Name: [id number] Site: Lumbar Spine; L1-S1 Technique: 3D Mode: Photon Dose Per Fraction: 3 Gy Prescribed Dose (Delivered / Prescribed): 30 Gy / 30 Gy Prescribed Fxs (Delivered / Prescribed): 10 / 10     ==========ON TREATMENT VISIT DATES========== 2023-06-08, 2023-06-15   See weekly On Treatment Notes in Epic for details in the Media tab (listed as Progress notes on the On Treatment Visit Dates listed above). The patient tolerated radiation. He developed fatigue and anticipated skin changes in the treatment field.   The patient will receive a call in about one month from the radiation oncology department. He will continue follow up with Dr. Ellin Saba as well as our clinic with repeat MRI brain in 3 months time. Also of note we did not treat his acromion region as the patient was not having symptoms, but if his symptoms return or progress, we could revisit options of radiotherapy.       Osker Mason, PAC

## 2023-06-22 ENCOUNTER — Other Ambulatory Visit: Payer: Self-pay | Admitting: Hematology

## 2023-06-25 ENCOUNTER — Inpatient Hospital Stay: Payer: 59

## 2023-06-25 ENCOUNTER — Other Ambulatory Visit: Payer: Self-pay

## 2023-06-25 ENCOUNTER — Encounter: Payer: Self-pay | Admitting: Hematology

## 2023-06-25 DIAGNOSIS — C641 Malignant neoplasm of right kidney, except renal pelvis: Secondary | ICD-10-CM

## 2023-06-25 MED ORDER — EVEROLIMUS 5 MG PO TABS: 5.0000 mg | ORAL_TABLET | Freq: Every day | ORAL | 2 refills | Status: DC

## 2023-06-25 MED ORDER — LENVATINIB (18 MG DAILY DOSE) 10 MG & 2 X 4 MG PO CPPK: 18.0000 mg | ORAL_CAPSULE | Freq: Every day | ORAL | 2 refills | Status: DC

## 2023-06-25 NOTE — Telephone Encounter (Signed)
 Chart reviewed. Everolimus and Lenvatinib refilled per last office note with Dr. Ellin Saba.

## 2023-06-29 ENCOUNTER — Other Ambulatory Visit: Payer: Self-pay

## 2023-06-29 MED ORDER — HYDROCODONE-ACETAMINOPHEN 10-325 MG PO TABS: 1.0000 | ORAL_TABLET | Freq: Three times a day (TID) | ORAL | 0 refills | Status: DC | PRN

## 2023-07-09 ENCOUNTER — Other Ambulatory Visit: Payer: Self-pay | Admitting: Radiation Oncology

## 2023-07-09 ENCOUNTER — Inpatient Hospital Stay

## 2023-07-09 ENCOUNTER — Inpatient Hospital Stay: Admitting: Hematology

## 2023-07-09 DIAGNOSIS — C7931 Secondary malignant neoplasm of brain: Secondary | ICD-10-CM

## 2023-07-09 DIAGNOSIS — C649 Malignant neoplasm of unspecified kidney, except renal pelvis: Secondary | ICD-10-CM

## 2023-07-11 ENCOUNTER — Other Ambulatory Visit: Payer: Self-pay

## 2023-07-13 NOTE — Progress Notes (Addendum)
  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  The patient did note fatigue during radiation but has since improved. The patient did note hair loss or skin changes in the field of radiation during therapy. The patient is taking dexamethasone. The patient does not have symptoms of  weakness or loss of control of the extremities. The patient does have symptoms of headache on occasion 4/10. The patient does not have symptoms of seizure or uncontrolled movement. The patient does not have symptoms of changes in vision. The patient does not have changes in speech. The patient does not have confusion.   Patient does report feelings of numbness throughout the body on occasion but is managing well.  The patient was counseled that he  will be contacted by our brain and spine navigator to schedule surveillance imaging. The patient was encouraged to call if he  have not received a call to schedule imaging, or if he develops concerns or questions regarding radiation. The patient will also continue to follow up with Dr. Ellin Saba in medical oncology.  This concludes the interaction.  Ruel Favors, LPN

## 2023-07-15 ENCOUNTER — Other Ambulatory Visit: Payer: Self-pay | Admitting: Hematology

## 2023-07-16 ENCOUNTER — Encounter: Payer: Self-pay | Admitting: Hematology

## 2023-07-16 ENCOUNTER — Encounter

## 2023-07-16 ENCOUNTER — Ambulatory Visit
Admission: RE | Admit: 2023-07-16 | Discharge: 2023-07-16 | Disposition: A | Discharge status: Home / Self Care | Source: Ambulatory Visit | Attending: Internal Medicine | Admitting: Internal Medicine

## 2023-07-17 ENCOUNTER — Inpatient Hospital Stay: Attending: Physician Assistant

## 2023-07-17 ENCOUNTER — Encounter: Payer: Self-pay | Admitting: *Deleted

## 2023-07-17 ENCOUNTER — Inpatient Hospital Stay (HOSPITAL_BASED_OUTPATIENT_CLINIC_OR_DEPARTMENT_OTHER): Admitting: Hematology

## 2023-07-17 VITALS — BP 118/76 | HR 60 | Temp 97.4°F | Resp 18 | Ht 72.0 in | Wt 206.4 lb

## 2023-07-17 DIAGNOSIS — C7951 Secondary malignant neoplasm of bone: Secondary | ICD-10-CM | POA: Diagnosis not present

## 2023-07-17 DIAGNOSIS — C7931 Secondary malignant neoplasm of brain: Secondary | ICD-10-CM | POA: Diagnosis not present

## 2023-07-17 DIAGNOSIS — C641 Malignant neoplasm of right kidney, except renal pelvis: Secondary | ICD-10-CM | POA: Diagnosis present

## 2023-07-17 DIAGNOSIS — I10 Essential (primary) hypertension: Secondary | ICD-10-CM | POA: Insufficient documentation

## 2023-07-17 DIAGNOSIS — Z79899 Other long term (current) drug therapy: Secondary | ICD-10-CM | POA: Diagnosis not present

## 2023-07-17 LAB — CBC WITH DIFFERENTIAL/PLATELET
Abs Immature Granulocytes: 0.42 10*3/uL — ABNORMAL HIGH (ref 0.00–0.07)
Basophils Absolute: 0 10*3/uL (ref 0.0–0.1)
Basophils Relative: 0 %
Eosinophils Absolute: 0 10*3/uL (ref 0.0–0.5)
Eosinophils Relative: 0 %
HCT: 32 % — ABNORMAL LOW (ref 39.0–52.0)
Hemoglobin: 10.7 g/dL — ABNORMAL LOW (ref 13.0–17.0)
Immature Granulocytes: 2 %
Lymphocytes Relative: 1 %
Lymphs Abs: 0.2 10*3/uL — ABNORMAL LOW (ref 0.7–4.0)
MCH: 33.6 pg (ref 26.0–34.0)
MCHC: 33.4 g/dL (ref 30.0–36.0)
MCV: 100.6 fL — ABNORMAL HIGH (ref 80.0–100.0)
Monocytes Absolute: 0.4 10*3/uL (ref 0.1–1.0)
Monocytes Relative: 2 %
Neutro Abs: 19 10*3/uL — ABNORMAL HIGH (ref 1.7–7.7)
Neutrophils Relative %: 95 %
Platelets: 152 10*3/uL (ref 150–400)
RBC: 3.18 MIL/uL — ABNORMAL LOW (ref 4.22–5.81)
RDW: 13.1 % (ref 11.5–15.5)
WBC: 20 10*3/uL — ABNORMAL HIGH (ref 4.0–10.5)
nRBC: 0 % (ref 0.0–0.2)

## 2023-07-17 LAB — URINALYSIS, DIPSTICK ONLY
Bilirubin Urine: NEGATIVE
Glucose, UA: NEGATIVE mg/dL
Ketones, ur: NEGATIVE mg/dL
Nitrite: NEGATIVE
Protein, ur: 30 mg/dL — AB
Specific Gravity, Urine: 1.017 (ref 1.005–1.030)
pH: 5 (ref 5.0–8.0)

## 2023-07-17 LAB — COMPREHENSIVE METABOLIC PANEL WITH GFR
ALT: 58 U/L — ABNORMAL HIGH (ref 0–44)
AST: 34 U/L (ref 15–41)
Albumin: 3.1 g/dL — ABNORMAL LOW (ref 3.5–5.0)
Alkaline Phosphatase: 98 U/L (ref 38–126)
Anion gap: 9 (ref 5–15)
BUN: 38 mg/dL — ABNORMAL HIGH (ref 6–20)
CO2: 25 mmol/L (ref 22–32)
Calcium: 8.8 mg/dL — ABNORMAL LOW (ref 8.9–10.3)
Chloride: 100 mmol/L (ref 98–111)
Creatinine, Ser: 1.08 mg/dL (ref 0.61–1.24)
GFR, Estimated: >60 mL/min (ref >60)
Glucose, Bld: 127 mg/dL — ABNORMAL HIGH (ref 70–99)
Potassium: 4.3 mmol/L (ref 3.5–5.1)
Sodium: 134 mmol/L — ABNORMAL LOW (ref 135–145)
Total Bilirubin: 0.3 mg/dL (ref 0.0–1.2)
Total Protein: 6.5 g/dL (ref 6.5–8.1)

## 2023-07-17 LAB — MAGNESIUM: Magnesium: 1.6 mg/dL — ABNORMAL LOW (ref 1.7–2.4)

## 2023-07-17 MED ORDER — MAGNESIUM OXIDE -MG SUPPLEMENT 400 (240 MG) MG PO TABS: 400.0000 mg | ORAL_TABLET | Freq: Every day | ORAL | 3 refills | Status: DC

## 2023-07-17 NOTE — Patient Instructions (Addendum)
 [facility] at [facility] Discharge Instructions   You were seen and examined today by Dr. Cheree Cords.  He reviewed the results of your lab work which are normal/stable.   Continue everolimus and lenvatinib as prescribed.   We will see you back in 6 weeks. We will repeat lab work and a scan prior to this visit.    Return as scheduled.    Thank you for choosing [facility] at [facility] to provide your oncology and hematology care.  To afford each patient quality time with our provider, please arrive at least 15 minutes before your scheduled appointment time.   If you have a lab appointment with the Cancer Center please come in thru the Main Entrance and check in at the main information desk.  You need to re-schedule your appointment should you arrive 10 or more minutes late.  We strive to give you quality time with our providers, and arriving late affects you and other patients whose appointments are after yours.  Also, if you no show three or more times for appointments you may be dismissed from the clinic at the providers discretion.     Again, thank you for choosing [facility].  Our hope is that these requests will decrease the amount of time that you wait before being seen by our physicians.       _____________________________________________________________  Should you have questions after your visit to [facility], please contact our office at [phone] and follow the prompts.  Our office hours are 8:00 a.m. and 4:30 p.m. Monday - Friday.  Please note that voicemails left after 4:00 p.m. may not be returned until the following business day.  We are closed weekends and major holidays.  You do have access to a nurse 24-7, just call the main number to the clinic [phone] and do not press any options, hold on the line and a nurse will answer the phone.    For prescription refill requests, have your pharmacy  contact our office and allow 72 hours.    Due to Covid, you will need to wear a mask upon entering the hospital. If you do not have a mask, a mask will be given to you at the Main Entrance upon arrival. For doctor visits, patients may have 1 support person age 11 or older with them. For treatment visits, patients can not have anyone with them due to social distancing guidelines and our immunocompromised population.

## 2023-07-17 NOTE — Progress Notes (Signed)
 Patient is taking Afinitor and Lenvima as prescribed. He has not missed any doses and reports no side effects at this time.
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
## 2023-07-19 ENCOUNTER — Telehealth: Payer: Self-pay | Admitting: *Deleted

## 2023-07-19 NOTE — Telephone Encounter (Signed)
 Per Dr. Cheree Cords, patient referred to Dr.Teoh or Dr. Tellis Feathers ASAP for nasal septal perforation.

## 2023-07-19 NOTE — Telephone Encounter (Signed)
**Note De-identified  [place] Obfuscation** Please advise 

## 2023-07-21 ENCOUNTER — Other Ambulatory Visit: Payer: Self-pay

## 2023-08-06 ENCOUNTER — Telehealth: Payer: Self-pay | Admitting: *Deleted

## 2023-08-06 ENCOUNTER — Other Ambulatory Visit: Payer: Self-pay | Admitting: *Deleted

## 2023-08-06 MED ORDER — HYDROMORPHONE HCL 2 MG PO TABS
2.0000 mg | ORAL_TABLET | ORAL | 0 refills | Status: DC | PRN
Start: 2023-08-06 — End: 2023-08-10

## 2023-08-06 NOTE — Telephone Encounter (Signed)
 Patient's wife notified us  that patient has begun having severe generalized pain, mostly in lower back that hydrocodone  is not touching.  Per Dr. Katragadda, Dilaudid  2 mg sent to pharmacy to take every 4 hours as needed.  He also recommended that we attempt to get scan moved up to assess for progression.  Patient aware and verbalized understanding.

## 2023-08-08 ENCOUNTER — Inpatient Hospital Stay: Attending: Physician Assistant

## 2023-08-08 DIAGNOSIS — C7931 Secondary malignant neoplasm of brain: Secondary | ICD-10-CM | POA: Diagnosis not present

## 2023-08-08 DIAGNOSIS — C641 Malignant neoplasm of right kidney, except renal pelvis: Secondary | ICD-10-CM | POA: Diagnosis present

## 2023-08-08 DIAGNOSIS — I10 Essential (primary) hypertension: Secondary | ICD-10-CM | POA: Insufficient documentation

## 2023-08-08 DIAGNOSIS — M25512 Pain in left shoulder: Secondary | ICD-10-CM | POA: Insufficient documentation

## 2023-08-08 DIAGNOSIS — Z79899 Other long term (current) drug therapy: Secondary | ICD-10-CM | POA: Insufficient documentation

## 2023-08-08 LAB — COMPREHENSIVE METABOLIC PANEL WITH GFR
ALT: 125 U/L — ABNORMAL HIGH (ref 0–44)
AST: 58 U/L — ABNORMAL HIGH (ref 15–41)
Albumin: 2.7 g/dL — ABNORMAL LOW (ref 3.5–5.0)
Alkaline Phosphatase: 155 U/L — ABNORMAL HIGH (ref 38–126)
Anion gap: 11 (ref 5–15)
BUN: 36 mg/dL — ABNORMAL HIGH (ref 6–20)
CO2: 23 mmol/L (ref 22–32)
Calcium: 8.8 mg/dL — ABNORMAL LOW (ref 8.9–10.3)
Chloride: 101 mmol/L (ref 98–111)
Creatinine, Ser: 1 mg/dL (ref 0.61–1.24)
GFR, Estimated: >60 mL/min (ref >60)
Glucose, Bld: 137 mg/dL — ABNORMAL HIGH (ref 70–99)
Potassium: 4.4 mmol/L (ref 3.5–5.1)
Sodium: 135 mmol/L (ref 135–145)
Total Bilirubin: 0.4 mg/dL (ref 0.0–1.2)
Total Protein: 6.3 g/dL — ABNORMAL LOW (ref 6.5–8.1)

## 2023-08-08 LAB — CBC WITH DIFFERENTIAL/PLATELET
Abs Immature Granulocytes: 1.1 10*3/uL — ABNORMAL HIGH (ref 0.00–0.07)
Band Neutrophils: 1 %
Basophils Absolute: 0 10*3/uL (ref 0.0–0.1)
Basophils Relative: 0 %
Eosinophils Absolute: 0 10*3/uL (ref 0.0–0.5)
Eosinophils Relative: 0 %
HCT: 30.9 % — ABNORMAL LOW (ref 39.0–52.0)
Hemoglobin: 10.6 g/dL — ABNORMAL LOW (ref 13.0–17.0)
Lymphocytes Relative: 2 %
Lymphs Abs: 0.7 10*3/uL (ref 0.7–4.0)
MCH: 33.4 pg (ref 26.0–34.0)
MCHC: 34.3 g/dL (ref 30.0–36.0)
MCV: 97.5 fL (ref 80.0–100.0)
Metamyelocytes Relative: 3 %
Monocytes Absolute: 0.7 10*3/uL (ref 0.1–1.0)
Monocytes Relative: 2 %
Neutro Abs: 33 10*3/uL — ABNORMAL HIGH (ref 1.7–7.7)
Neutrophils Relative %: 92 %
Platelets: 140 10*3/uL — ABNORMAL LOW (ref 150–400)
RBC: 3.17 MIL/uL — ABNORMAL LOW (ref 4.22–5.81)
RDW: 13.8 % (ref 11.5–15.5)
WBC: 35.5 10*3/uL — ABNORMAL HIGH (ref 4.0–10.5)
nRBC: 0.3 % — ABNORMAL HIGH (ref 0.0–0.2)

## 2023-08-08 LAB — URINALYSIS, DIPSTICK ONLY
Bilirubin Urine: NEGATIVE
Glucose, UA: NEGATIVE mg/dL
Ketones, ur: NEGATIVE mg/dL
Nitrite: NEGATIVE
Protein, ur: 100 mg/dL — AB
Specific Gravity, Urine: 1.016 (ref 1.005–1.030)
pH: 6 (ref 5.0–8.0)

## 2023-08-08 LAB — MAGNESIUM: Magnesium: 1.6 mg/dL — ABNORMAL LOW (ref 1.7–2.4)

## 2023-08-10 ENCOUNTER — Other Ambulatory Visit: Payer: Self-pay

## 2023-08-10 MED ORDER — HYDROMORPHONE HCL 2 MG PO TABS: 2.0000 mg | ORAL_TABLET | ORAL | 0 refills | Status: DC | PRN

## 2023-08-13 ENCOUNTER — Ambulatory Visit (HOSPITAL_BASED_OUTPATIENT_CLINIC_OR_DEPARTMENT_OTHER)
Admission: RE | Admit: 2023-08-13 | Discharge: 2023-08-13 | Disposition: A | Discharge status: Home / Self Care | Source: Ambulatory Visit | Attending: Hematology | Admitting: Hematology

## 2023-08-13 DIAGNOSIS — C641 Malignant neoplasm of right kidney, except renal pelvis: Secondary | ICD-10-CM

## 2023-08-13 MED ORDER — IOHEXOL 300 MG/ML  SOLN
100.0000 mL | Freq: Once | INTRAMUSCULAR | Status: AC | PRN
  Administered 2023-08-13: 85 mL via INTRAVENOUS

## 2023-08-20 ENCOUNTER — Other Ambulatory Visit: Payer: Self-pay | Admitting: *Deleted

## 2023-08-20 ENCOUNTER — Inpatient Hospital Stay: Admitting: Hematology

## 2023-08-20 VITALS — BP 120/87 | HR 88 | Temp 96.2°F | Resp 20 | Wt 201.3 lb

## 2023-08-20 DIAGNOSIS — C649 Malignant neoplasm of unspecified kidney, except renal pelvis: Secondary | ICD-10-CM

## 2023-08-20 DIAGNOSIS — C641 Malignant neoplasm of right kidney, except renal pelvis: Secondary | ICD-10-CM | POA: Diagnosis not present

## 2023-08-20 MED ORDER — HYDROCODONE-ACETAMINOPHEN 10-325 MG PO TABS: 1.0000 | ORAL_TABLET | ORAL | 0 refills | Status: DC | PRN

## 2023-08-20 MED ORDER — FUROSEMIDE 20 MG PO TABS: 20.0000 mg | ORAL_TABLET | Freq: Every day | ORAL | 2 refills | Status: DC | PRN

## 2023-08-20 NOTE — Progress Notes (Signed)
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 surgical changes of gastric sleeve. Small bowel is nondilated. Large bowel has a normal course and caliber with scattered diffuse colonic stool. There is a redundant course of the sigmoid colon extending into the left upper quadrant of the abdomen. Vascular/Lymphatic: Normal caliber aorta and IVC. There is nonspecific retroperitoneal stranding which appears to have increased. Once again there are several abnormal retroperitoneal lymph nodes. Example left para-aortic node which measured 18 x 13 mm on the previous examination, today is smaller on image 76 of series 3 measuring 9 by 7 mm. However there are some increasing right-sided retroperitoneal nodes. A few other previous nodes in the retroperitoneum are smaller. Example posterior to the IVC has a focus measuring 3.0 x 2.1 cm. Additional enlarging nodes to the right of the IVC and other areas retrocaval. There is a small node nodule or lymph node seen on the left side at the level of the upper iliac bone on image 95 measuring 8 x 6 mm. This could be a  additional abnormal lymph node in this location. Similar focus on the right side on image 91 is new. Reproductive: Prostate is unremarkable. Other: Small fat containing left inguinal hernia. Mild anasarca. Scattered mesenteric haziness. Musculoskeletal: Scattered degenerative changes along the spine. There is some heterogeneous appearance seen along the course of the canal in the lumbar spine. Please correlate with previous MRI. Areas of disc bulging and posterior osteophytes are also noted in the lower lumbar spine with stenosis. IMPRESSION: Although there are some mixed areas response, overall progression of disease. The primary right renal lesion is larger in size and there is some additional low-density areas along the lower pole the right kidney which are new. In addition there is delayed enhancement excretion from the right kidney with invasion of mass with thickening along the course of the intrarenal collecting system on the right. New perinephric space soft tissue mass extending superior from the right kidney. Previously there were some abnormal left-sided retroperitoneal nodes. These have improved from previous. However there are new areas of pathologic nodal enlargement along the right side of the retroperitoneum near the IVC including retrocaval. Dish in all small nodes identified in the space anterior to the upper iliac bones. Small nodes in the supraclavicular region on the left. These are worrisome. New right adrenal mass. Previous multiple lung nodules are similar or smaller. However there are some new small nodules identified in the superior segment of both lower lobes. In addition there are several new reticulonodular areas seen scattered in both lungs. These have a differential including atypical infectious or inflammatory process versus atypical lung metastases. There is 1 larger area in the lingula which is new but has a nodular margin and could be either. Recommend either short  follow-up or  PET-CT scan. Stable areas of bony sclerosis along the thoracic spine. The canal metastatic foci seen on the recent MRI are not well seen on the CT scan but there is some heterogeneity along the central canal of the lumbar spine. Electronically Signed: By: Adrianna Horde M.D. On: 08/16/2023 15:20

## 2023-08-20 NOTE — Patient Instructions (Addendum)
 [facility] at [facility] Discharge Instructions   You were seen and examined today by Dr. Cheree Cords.  He reviewed the results of your lab work which are normal/stable.   He reviewed the results of your CT scan which is unclear if the cancer has gotten worse. Dr. Linnell Richardson is having the radiologist compare your most recent scan to the scan done in [place]. If it shows that the cancer has progressed, we will discuss changing treatment. He will also reach out to Dr. Maebelle Schmid at [facility] regarding clinical trials.   We will see you back in 2 weeks.   Return as scheduled.    Thank you for choosing [facility] at [facility] to provide your oncology and hematology care.  To afford each patient quality time with our provider, please arrive at least 15 minutes before your scheduled appointment time.   If you have a lab appointment with the Cancer Center please come in thru the Main Entrance and check in at the main information desk.  You need to re-schedule your appointment should you arrive 10 or more minutes late.  We strive to give you quality time with our providers, and arriving late affects you and other patients whose appointments are after yours.  Also, if you no show three or more times for appointments you may be dismissed from the clinic at the providers discretion.     Again, thank you for choosing [facility].  Our hope is that these requests will decrease the amount of time that you wait before being seen by our physicians.       _____________________________________________________________  Should you have questions after your visit to [facility], please contact our office at [phone] and follow the prompts.  Our office hours are 8:00 a.m. and 4:30 p.m. Monday - Friday.  Please note that voicemails left after 4:00 p.m. may not be returned until the following business day.  We are closed weekends and major  holidays.  You do have access to a nurse 24-7, just call the main number to the clinic [phone] and do not press any options, hold on the line and a nurse will answer the phone.    For prescription refill requests, have your pharmacy contact our office and allow 72 hours.    Due to Covid, you will need to wear a mask upon entering the hospital. If you do not have a mask, a mask will be given to you at the Main Entrance upon arrival. For doctor visits, patients may have 1 support person age 63 or older with them. For treatment visits, patients can not have anyone with them due to social distancing guidelines and our immunocompromised population.

## 2023-08-20 NOTE — Progress Notes (Signed)
 Patient is taking everolimus  and lenvatinib  as prescribed. He has not missed any doses and reports no side effects at this time.

## 2023-08-21 ENCOUNTER — Other Ambulatory Visit (HOSPITAL_COMMUNITY): Payer: Self-pay

## 2023-08-21 ENCOUNTER — Encounter: Payer: Self-pay | Admitting: Hematology

## 2023-08-21 ENCOUNTER — Telehealth: Payer: Self-pay | Admitting: Pharmacist

## 2023-08-21 ENCOUNTER — Other Ambulatory Visit: Payer: Self-pay | Admitting: Hematology

## 2023-08-21 ENCOUNTER — Telehealth: Payer: Self-pay | Admitting: *Deleted

## 2023-08-21 ENCOUNTER — Encounter: Payer: Self-pay | Admitting: *Deleted

## 2023-08-21 ENCOUNTER — Telehealth: Payer: Self-pay | Admitting: Pharmacy Technician

## 2023-08-21 DIAGNOSIS — C641 Malignant neoplasm of right kidney, except renal pelvis: Secondary | ICD-10-CM

## 2023-08-21 MED ORDER — ERLOTINIB HCL 150 MG PO TABS: 150.0000 mg | ORAL_TABLET | Freq: Every day | ORAL | 2 refills | Status: DC

## 2023-08-21 NOTE — Telephone Encounter (Signed)
 Clinical Pharmacist Practitioner Encounter   Received new prescription for Tarceva (erlotinib) for the treatment of progressive metastatic papillary renal cell carcinoma, in conjunction with bevacizumab , planned duration until disease progression or unacceptable drug toxicity.  CMP from 08/08/23 assessed, no relevant lab abnormalities. Prescription dose and frequency assessed.   Current medication list in Epic reviewed, several DDIs with erlotinib identified: Omeprazole: Category X interaction, PPIs may decrease serum concentration of Erlotinib. It is recommended to avoid concurrent administration of erlotinib with PPIs Alternatives to PPIs: H2 antagonist (ex. Famotidine :): if used erlotinib should be dosed once daily, at least 2 hours before or 10 hours after H2-antagonist dosing Calcium Carbonate (ex. Tums): Separate the administration of erlotinib and any antacid by several hours Magnesium  oxide: Magnesium  oxide may decrease serum concentration of Erlotinib. Separate the administration of erlotinib and magnesium  oxide by several hours Simvastatin: Erlotinib may increase adverse/toxic effects of Simvastatin. Monitor for increased simvastatin adverse effects (eg, myopathy, rhabdomyolysis) if simvastatin and erlotinib are coadministered. No baseline dose adjustment needed.   Evaluated chart and no patient barriers to medication adherence identified.   Prescription has been e-scribed to [employer] for benefits analysis and approval.  Oral Oncology Clinic will continue to follow for insurance authorization, copayment issues, initial counseling and start date.   Arta Stump N. Uday Jantz, PharmD, BCOP, CPP Hematology/Oncology Clinical Pharmacist [facility] Oral Chemotherapy Navigation Clinic [phone]  08/21/2023 2:27 PM

## 2023-08-21 NOTE — Telephone Encounter (Signed)
 Oral Oncology Patient Advocate Encounter  Prior Authorization for Erlotinib has been approved.    PA# [id number] Effective dates: 08/21/2023 through 08/20/2024  Patient must fill through [facility].  Supporting documents have been sent to [facility].  Patty Benjaman Branch, CPhT Oncology Pharmacy Patient Advocate [facility] Direct Number: [phone] Fax: [phone]

## 2023-08-21 NOTE — Telephone Encounter (Signed)
 Oral Oncology Patient Advocate Encounter   Received notification that prior authorization for Erlotinib is required.   PA submitted on 08/21/2023 Key [id number] Status is pending     Patty Benjaman Branch, CPhT Oncology Pharmacy Patient Advocate [facility] Direct Number: [phone] Fax: [phone]

## 2023-08-21 NOTE — Telephone Encounter (Signed)
 Per Dr. Cheree Cords, patient informed that CT scan was read comparing to scan from [facility] in February. It showed progression. He was advised to stop lenvatinib  and everolimus . Dr. Cheree Cords discussed with Dr. Ladean Picket at [facility]. They do not have clinical trials. The next option would be to try combination of erlotinib tablet 150 mg daily and bevacizumab infusion. Script sent to [facility] for erlotinib tablet  Advised him to expect a call from front desk to schedule him for bevacizumab infusions.  Patient verbalized understanding and all questions answered.

## 2023-08-21 NOTE — Progress Notes (Signed)
 DISCONTINUE OFF PATHWAY REGIMEN - Renal Cell   [id number]  (Cabometyx ) 40 mg PO Daily D1-28 + Nivolumab  480 mg IV D1 q28 Days:   A cycle is every 28 days:     Cabozantinib  (tablet)      Nivolumab    **Always confirm dose/schedule in your pharmacy ordering system**  PRIOR TREATMENT: Off Pathway: Cabozantinib  (Cabometyx ) 40 mg PO Daily D1-28 + Nivolumab  480 mg IV D1 q28 Days  START OFF PATHWAY REGIMEN - Renal Cell   [id number]:Bevacizumab 10 mg/kg IV D1 q14 Days:   A cycle is every 14 days:     Bevacizumab-xxxx   **Always confirm dose/schedule in your pharmacy ordering system**  Patient Characteristics: Stage IV (Unresected T4M0 or Any T, M1)/Metastatic Disease, Non-Clear Cell, Second Line and Beyond Therapeutic Status: Stage IV (Unresected T4M0 or Any T, M1)/Metastatic Disease Histology: Non-Clear Cell Line of therapy: Second Line and Beyond Intent of Therapy: Non-Curative / Palliative Intent, Discussed with Patient

## 2023-08-23 ENCOUNTER — Emergency Department (HOSPITAL_COMMUNITY)
Admission: EM | Admit: 2023-08-23 | Discharge: 2023-08-23 | Disposition: A | Discharge status: Home / Self Care | Attending: Emergency Medicine | Admitting: Emergency Medicine

## 2023-08-23 ENCOUNTER — Other Ambulatory Visit: Payer: Self-pay

## 2023-08-23 ENCOUNTER — Emergency Department (HOSPITAL_COMMUNITY)

## 2023-08-23 ENCOUNTER — Encounter (HOSPITAL_COMMUNITY): Payer: Self-pay | Admitting: Emergency Medicine

## 2023-08-23 DIAGNOSIS — D72829 Elevated white blood cell count, unspecified: Secondary | ICD-10-CM | POA: Diagnosis not present

## 2023-08-23 DIAGNOSIS — R569 Unspecified convulsions: Secondary | ICD-10-CM | POA: Diagnosis not present

## 2023-08-23 DIAGNOSIS — N179 Acute kidney failure, unspecified: Secondary | ICD-10-CM | POA: Diagnosis not present

## 2023-08-23 DIAGNOSIS — Z85528 Personal history of other malignant neoplasm of kidney: Secondary | ICD-10-CM | POA: Insufficient documentation

## 2023-08-23 DIAGNOSIS — W228XXA Striking against or struck by other objects, initial encounter: Secondary | ICD-10-CM | POA: Insufficient documentation

## 2023-08-23 DIAGNOSIS — S0001XA Abrasion of scalp, initial encounter: Secondary | ICD-10-CM | POA: Insufficient documentation

## 2023-08-23 DIAGNOSIS — S0990XA Unspecified injury of head, initial encounter: Secondary | ICD-10-CM | POA: Diagnosis present

## 2023-08-23 LAB — CBC WITH DIFFERENTIAL/PLATELET
Abs Immature Granulocytes: 0 10*3/uL (ref 0.00–0.07)
Basophils Absolute: 0 10*3/uL (ref 0.0–0.1)
Basophils Relative: 0 %
Eosinophils Absolute: 0 10*3/uL (ref 0.0–0.5)
Eosinophils Relative: 0 %
HCT: 29.2 % — ABNORMAL LOW (ref 39.0–52.0)
Hemoglobin: 9.8 g/dL — ABNORMAL LOW (ref 13.0–17.0)
Lymphocytes Relative: 0 %
Lymphs Abs: 0 10*3/uL — ABNORMAL LOW (ref 0.7–4.0)
MCH: 32.1 pg (ref 26.0–34.0)
MCHC: 33.6 g/dL (ref 30.0–36.0)
MCV: 95.7 fL (ref 80.0–100.0)
Monocytes Absolute: 1.8 10*3/uL — ABNORMAL HIGH (ref 0.1–1.0)
Monocytes Relative: 4 %
Neutro Abs: 44.3 10*3/uL — ABNORMAL HIGH (ref 1.7–7.7)
Neutrophils Relative %: 96 %
Platelets: 150 10*3/uL (ref 150–400)
RBC: 3.05 MIL/uL — ABNORMAL LOW (ref 4.22–5.81)
RDW: 14.7 % (ref 11.5–15.5)
WBC: 46.1 10*3/uL — ABNORMAL HIGH (ref 4.0–10.5)
nRBC: 0.4 % — ABNORMAL HIGH (ref 0.0–0.2)

## 2023-08-23 LAB — COMPREHENSIVE METABOLIC PANEL WITH GFR
ALT: 56 U/L — ABNORMAL HIGH (ref 0–44)
AST: 44 U/L — ABNORMAL HIGH (ref 15–41)
Albumin: 2.1 g/dL — ABNORMAL LOW (ref 3.5–5.0)
Alkaline Phosphatase: 183 U/L — ABNORMAL HIGH (ref 38–126)
Anion gap: 16 — ABNORMAL HIGH (ref 5–15)
BUN: 43 mg/dL — ABNORMAL HIGH (ref 6–20)
CO2: 20 mmol/L — ABNORMAL LOW (ref 22–32)
Calcium: 8.5 mg/dL — ABNORMAL LOW (ref 8.9–10.3)
Chloride: 98 mmol/L (ref 98–111)
Creatinine, Ser: 1.75 mg/dL — ABNORMAL HIGH (ref 0.61–1.24)
GFR, Estimated: 45 mL/min — ABNORMAL LOW (ref >60)
Glucose, Bld: 178 mg/dL — ABNORMAL HIGH (ref 70–99)
Potassium: 4 mmol/L (ref 3.5–5.1)
Sodium: 134 mmol/L — ABNORMAL LOW (ref 135–145)
Total Bilirubin: 0.4 mg/dL (ref 0.0–1.2)
Total Protein: 6 g/dL — ABNORMAL LOW (ref 6.5–8.1)

## 2023-08-23 MED ORDER — LEVETIRACETAM 500 MG PO TABS: 500.0000 mg | ORAL_TABLET | Freq: Two times a day (BID) | ORAL | 0 refills | Status: DC

## 2023-08-23 MED ORDER — SODIUM CHLORIDE 0.9 % IV BOLUS
1000.0000 mL | Freq: Once | INTRAVENOUS | Status: AC
  Administered 2023-08-23: 1000 mL via INTRAVENOUS

## 2023-08-23 MED ORDER — DOXYCYCLINE HYCLATE 100 MG PO TABS: 100.0000 mg | ORAL_TABLET | Freq: Two times a day (BID) | ORAL | 1 refills | Status: DC

## 2023-08-23 MED ORDER — LEVETIRACETAM (KEPPRA) 500 MG/5 ML ADULT IV PUSH
1000.0000 mg | Freq: Once | INTRAVENOUS | Status: AC
  Administered 2023-08-23: 1000 mg via INTRAVENOUS
  Filled 2023-08-23: qty 10

## 2023-08-23 NOTE — Telephone Encounter (Addendum)
 Clinical Pharmacist Practitioner Encounter   Patient Education I spoke with patient for overview of new oral chemotherapy medication:  Tarceva (erlotinib) for the treatment of progressive metastatic papillary renal cell carcinoma, in conjunction with bevacizumab , planned duration until disease progression or unacceptable drug toxicity.   Counseled patient on administration, dosing, side effects, monitoring, drug-food interactions, safe handling, storage, and disposal. Patient will take 1 tablet (150 mg total) by mouth daily. Take on an empty stomach 1 hour before meals or 2 hours after.   Side effects include but not limited to: acne-like rash, nausea, diarrhea, fatigue. Diarrhea: patient knows to use loperamide as needed and call the office if they are having four or more loose stool per day   Rash: He will start taking doxycycline  as rash prophylaxis and he knows to call the office if rash occurs.   Reviewed with patient importance of keeping a medication schedule and plan for any missed doses.  After discussion with patient no patient barriers to medication adherence identified.   [name] voiced understanding and appreciation. All questions answered. Medication handout provided.  Provided patient with Oral Chemotherapy Navigation Clinic phone number. Patient knows to call the office with questions or concerns. Oral Chemotherapy Navigation Clinic will continue to follow.  George Crane, PharmD, BCOP, CPP Hematology/Oncology Clinical Pharmacist [facility] Oral Chemotherapy Navigation Clinic [phone]  08/23/2023 3:05 PM

## 2023-08-23 NOTE — ED Notes (Signed)
 Patient transported to CT

## 2023-08-23 NOTE — Discharge Instructions (Addendum)
 As we discussed, you have a seizure.  I have prescribed Keppra 500 mg twice daily   I have also referred you to a neurologist.  I recommend you do not drive until cleared by neurologist  As we also discussed, your white blood cell count is elevated and your kidney function slightly abnormal.  I recommend that you have a repeat white blood cell count and kidney function test with your oncologist next week  Return to ER if you have fever or another seizure or severe pain

## 2023-08-23 NOTE — ED Provider Notes (Signed)
 [place] EMERGENCY DEPARTMENT AT [facility] Provider Note   CSN: [id number] Arrival date & time: 08/23/23  1748     History  Chief Complaint  Patient presents with   Seizures    George Crane is a 58 y.o. 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  Patient states that he was at work and saw his hands trembling.  He felt that he was going to have a seizure and then may have passed out and hit his head.  Patient states that he somewhat remembers the whole incident.  Patient states that he is getting radiation therapy for his metastatic renal cell carcinoma currently.  He states that he was on Keppra 500 mg twice daily but was discontinued by oncology since he is already undergoing radiation therapy.  The history is provided by the patient.       Home Medications Prior to Admission medications   Medication Sig Start Date End Date Taking? Authorizing Provider  aluminum -magnesium  hydroxide 200-200 MG/5ML suspension Mix 15 ML with lidocaine  and swish and spit for mouth sores 4 times daily 12/18/22   Katragadda, Sreedhar, MD  amLODipine  (NORVASC ) 10 MG tablet TAKE 1 TABLET BY MOUTH EVERY DAY 07/16/23   Paulett Boros, MD  dexamethasone  (DECADRON ) 4 MG tablet TAKE 1 TABLET BY MOUTH EVERY DAY 07/16/23   Paulett Boros, MD  doxycycline  (VIBRA -TABS) 100 MG tablet Take 1 tablet (100 mg total) by mouth 2 (two) times daily. 08/23/23   Paulett Boros, MD  erlotinib (TARCEVA) 150 MG tablet Take 1 tablet (150 mg total) by mouth daily. Take on an empty stomach 1 hour before meals or 2 hours after. 08/21/23   Paulett Boros, MD  furosemide  (LASIX ) 20 MG tablet Take 1 tablet (20 mg total) by mouth daily as needed. 08/20/23   Paulett Boros, MD  hydrochlorothiazide  (HYDRODIURIL ) 25 MG tablet TAKE 1 TABLET (25 MG TOTAL) BY MOUTH DAILY. 05/31/23   Paulett Boros, MD  HYDROcodone -acetaminophen   (NORCO) 10-325 MG tablet Take 1 tablet by mouth every 4 (four) hours as needed. 08/20/23   Paulett Boros, MD  levothyroxine (SYNTHROID) 200 MCG tablet Take 200 mcg by mouth every morning. 11/07/22   [provider]  lidocaine  (XYLOCAINE ) 2 % solution Mix 15 ML with Maalox and swish and spit 4 times daily 12/18/22   Paulett Boros, MD  losartan  (COZAAR ) 100 MG tablet TAKE 1 TABLET BY MOUTH EVERY DAY 06/25/23   Paulett Boros, MD  magnesium  oxide (MAG-OX) 400 (240 Mg) MG tablet Take 1 tablet (400 mg total) by mouth daily. 07/17/23   Paulett Boros, MD  meloxicam (MOBIC) 15 MG tablet Take 15 mg by mouth daily as needed. 10/01/22   [provider]  Omega-3 Fatty Acids (FISH OIL) 1000 MG CAPS Take 1,000 mg by mouth daily. 1 tab po qd     [provider]  omeprazole (PRILOSEC) 20 MG capsule Take 20 mg by mouth daily.      [provider]  prochlorperazine  (COMPAZINE ) 10 MG tablet Take 1 tablet (10 mg total) by mouth every 6 (six) hours as needed for nausea or vomiting. 11/16/22   Paulett Boros, MD  simvastatin (ZOCOR) 20 MG tablet Take 20 mg by mouth daily.  11/22/15   [provider]      Allergies    Patient has no known allergies.    Review of Systems   Review of Systems  Neurological:  Positive  for seizures.  All other systems reviewed and are negative.   Physical Exam Updated Vital Signs BP 127/74 (BP Location: Right Arm)   Pulse 95   Temp 97.7 F (36.5 C) (Oral)   Resp 12   SpO2 97%  Physical Exam Vitals and nursing note reviewed.  Constitutional:      Comments: Awake and alert  HENT:     Head:     Comments: Abrasion on the posterior scalp with bleeding controlled.  No obvious laceration    Nose: Nose normal.     Mouth/Throat:     Mouth: Mucous membranes are moist.  Eyes:     Extraocular Movements: Extraocular movements intact.     Pupils: Pupils are equal, round, and reactive to light.  Neck:     Comments:  C-collar in place Cardiovascular:     Rate and Rhythm: Normal rate and regular rhythm.     Pulses: Normal pulses.     Heart sounds: Normal heart sounds.  Pulmonary:     Effort: Pulmonary effort is normal.     Breath sounds: Normal breath sounds.  Abdominal:     General: Abdomen is flat.     Palpations: Abdomen is soft.  Musculoskeletal:        General: Normal range of motion.  Skin:    General: Skin is warm.  Neurological:     General: No focal deficit present.     Mental Status: He is oriented to person, place, and time.  Psychiatric:        Mood and Affect: Mood normal.        Behavior: Behavior normal.     ED Results / Procedures / Treatments   Labs (all labs ordered are listed, but only abnormal results are displayed) Labs Reviewed  CBC WITH DIFFERENTIAL/PLATELET  COMPREHENSIVE METABOLIC PANEL WITH GFR    EKG EKG Interpretation Date/Time:  Thursday Aug 23 2023 17:57:20 EDT Ventricular Rate:  101 PR Interval:  163 QRS Duration:  95 QT Interval:  334 QTC Calculation: 433 R Axis:   -8  Text Interpretation: Sinus tachycardia Probable left atrial enlargement Inferior infarct, old No significant change since last tracing Confirmed by Florette Hurry [id number]) on 08/23/2023 6:05:33 PM  Radiology No results found.  Procedures Procedures    Medications Ordered in ED Medications  levETIRAcetam (KEPPRA) undiluted injection 1,000 mg (1,000 mg Intravenous Given 08/23/23 1824)    ED Course/ Medical Decision Making/ A&P                                 Medical Decision Making George Crane is a 58 y.o. male here presenting with seizure-like activity.  Patient has a history of brain mets from renal cell carcinoma.  Patient is definitely at risk for seizures.  I discussed with Dr. Cleone Dad from neurology.  She recommend 1000 mg of Keppra load and just restart Keppra 500 twice daily.  Will also get basic labs and CT head and cervical spine.  9:27 PM Reviewed patient's labs and  white blood cell count is up to 46,000.  His baseline white blood cell count is 35,000.  Patient has predominantly neutrophils.  Patient also has mild AKI with creatinine of 1.7 at baseline of 1.  Discussed case with Dr. Lee Public from oncology.  He states that since patient is on Decadron  chronically that can cause it.  I reviewed his CT head and patient does have known  metastatic disease on CT head and no cervical fracture.  Patient will be discharged home with Keppra 500 mg twice daily.  Patient has follow-up with oncology next week and I recommend he repeat a CBC.  If he starts running a fever he will need to return to the ER.  Problems Addressed: AKI (acute kidney injury) (HCC): acute illness or injury Leukocytosis, unspecified type: acute illness or injury Seizure [facility]): acute illness or injury  Amount and/or Complexity of Data Reviewed Labs: ordered. Decision-making details documented in ED Course. Radiology: ordered and independent interpretation performed. Decision-making details documented in ED Course. ECG/medicine tests: ordered and independent interpretation performed. Decision-making details documented in ED Course.    Final Clinical Impression(s) / ED Diagnoses Final diagnoses:  None    Rx / DC Orders ED Discharge Orders     None         Dalene Duck, MD 08/23/23 2129

## 2023-08-23 NOTE — Telephone Encounter (Signed)
 [facility] to deliver medication today 08/23/23, copay $9.41

## 2023-08-23 NOTE — ED Triage Notes (Signed)
 Pt BIB [facility] from work due to onset of dizziness and seizure that lasted about 2 minutes per co-workers.  Pt does have stage 4 kidney cancer that has metastasized to brain.  Pt currently receiving radiation treatment for it.  Pt does have mild abrasion to back of head.  EMS C-collar in place.  Nose bleed controlled. 18g left AC. VS BP 120/80, CBG 180, HR 90, SpO2 100%

## 2023-08-24 ENCOUNTER — Inpatient Hospital Stay

## 2023-08-24 ENCOUNTER — Other Ambulatory Visit (HOSPITAL_COMMUNITY)

## 2023-08-24 ENCOUNTER — Encounter: Payer: Self-pay | Admitting: Neurology

## 2023-08-24 NOTE — Telephone Encounter (Signed)
 Spoke George Crane, explained that a prescription for doxycycline  was sent to his local pharmacy and he should start this along with his erlotinib to help decrease the risk of rash/acne-like rash.   Reviewed the instructions for taking the doxycycline . He start his understanding of the plan.

## 2023-08-26 ENCOUNTER — Other Ambulatory Visit: Payer: Self-pay

## 2023-08-27 ENCOUNTER — Other Ambulatory Visit: Payer: Self-pay

## 2023-08-29 ENCOUNTER — Telehealth: Payer: Self-pay | Admitting: *Deleted

## 2023-08-29 ENCOUNTER — Inpatient Hospital Stay: Admitting: Hematology

## 2023-08-29 NOTE — Telephone Encounter (Signed)
 Wife Pam called and he had a seizure at work on 5/22. They started him on Kepppra and he was doing well up until Sunday. He started erlotinib  that day and has been sick since. She cannot get him to eat or drink anything. Very weak and somewhat "out of it", and would not be able to get him to appointment tomorrow.  Advised to call 911.  Dr. Katragadda made aware.  Infusion appointment cancelled.  Verbalized understanding.

## 2023-08-29 NOTE — Progress Notes (Signed)
 Pharmacist Chemotherapy Monitoring - Initial Assessment    Anticipated start date: 08/30/23   The following has been reviewed per standard work regarding the patient's treatment regimen: The patient's diagnosis, treatment plan and drug doses, and organ/hematologic function Lab orders and baseline tests specific to treatment regimen  The treatment plan start date, drug sequencing, and pre-medications Prior authorization status  Patient's documented medication list, including drug-drug interaction screen and prescriptions for anti-emetics and supportive care specific to the treatment regimen The drug concentrations, fluid compatibility, administration routes, and timing of the medications to be used The patient's access for treatment and lifetime cumulative dose history, if applicable  The patient's medication allergies and previous infusion related reactions, if applicable   Changes made to treatment plan:  N/A  Follow up needed:  N/A     Artie Laster, [facility], 08/29/2023  10:38 AM

## 2023-08-30 ENCOUNTER — Inpatient Hospital Stay

## 2023-08-30 DIAGNOSIS — C641 Malignant neoplasm of right kidney, except renal pelvis: Secondary | ICD-10-CM

## 2023-09-02 DEATH — deceased

## 2023-09-03 ENCOUNTER — Ambulatory Visit: Admitting: Hematology

## 2023-09-03 ENCOUNTER — Other Ambulatory Visit

## 2023-09-04 ENCOUNTER — Inpatient Hospital Stay: Admitting: Hematology

## 2023-09-04 ENCOUNTER — Inpatient Hospital Stay

## 2023-09-08 ENCOUNTER — Other Ambulatory Visit: Payer: Self-pay | Admitting: Hematology

## 2023-09-12 ENCOUNTER — Inpatient Hospital Stay: Admitting: Hematology

## 2023-09-12 ENCOUNTER — Inpatient Hospital Stay

## 2023-09-14 ENCOUNTER — Ambulatory Visit: Admitting: Urology

## 2023-09-26 ENCOUNTER — Inpatient Hospital Stay: Admitting: Hematology

## 2023-09-26 ENCOUNTER — Inpatient Hospital Stay

## 2023-09-27 ENCOUNTER — Other Ambulatory Visit

## 2023-09-28 ENCOUNTER — Institutional Professional Consult (permissible substitution) (INDEPENDENT_AMBULATORY_CARE_PROVIDER_SITE_OTHER): Admitting: Otolaryngology

## 2023-11-02 ENCOUNTER — Ambulatory Visit: Admitting: Neurology

## 2024-02-25 ENCOUNTER — Encounter: Payer: Self-pay | Admitting: Oncology
